# Patient Record
Sex: Male | Born: 1960
Health system: Southern US, Community
[De-identification: ages and names within clinical notes are randomized; demographics above are authoritative.]

## PROBLEM LIST (undated history)

## (undated) DIAGNOSIS — I10 Essential (primary) hypertension: Secondary | ICD-10-CM

## (undated) HISTORY — PX: CARDIAC CATHETERIZATION: SHX172

## (undated) HISTORY — PX: CARDIAC SURGERY: SHX584

---

## 2004-10-02 ENCOUNTER — Ambulatory Visit: Payer: Self-pay

## 2006-06-09 ENCOUNTER — Encounter: Admission: RE | Admit: 2006-06-09 | Discharge: 2006-06-09 | Payer: Self-pay | Admitting: Cardiovascular Disease

## 2006-06-10 ENCOUNTER — Ambulatory Visit (HOSPITAL_COMMUNITY): Admission: RE | Admit: 2006-06-10 | Discharge: 2006-06-10 | Payer: Self-pay | Admitting: Cardiovascular Disease

## 2010-02-01 ENCOUNTER — Encounter: Admission: RE | Admit: 2010-02-01 | Discharge: 2010-02-01 | Payer: Self-pay | Admitting: Cardiovascular Disease

## 2010-02-02 ENCOUNTER — Ambulatory Visit: Payer: Self-pay | Admitting: Cardiology

## 2010-02-02 ENCOUNTER — Ambulatory Visit (HOSPITAL_COMMUNITY): Admission: RE | Admit: 2010-02-02 | Discharge: 2010-02-03 | Payer: Self-pay | Admitting: Cardiovascular Disease

## 2010-02-08 ENCOUNTER — Encounter (HOSPITAL_COMMUNITY): Admission: RE | Admit: 2010-02-08 | Discharge: 2010-03-15 | Payer: Self-pay | Admitting: Cardiovascular Disease

## 2010-06-01 ENCOUNTER — Encounter: Admission: RE | Admit: 2010-06-01 | Discharge: 2010-06-01 | Payer: Self-pay | Admitting: Cardiovascular Disease

## 2010-09-29 LAB — URINALYSIS, ROUTINE W REFLEX MICROSCOPIC
Ketones, ur: NEGATIVE mg/dL
Leukocytes, UA: NEGATIVE
Nitrite: NEGATIVE
Protein, ur: NEGATIVE mg/dL
Urobilinogen, UA: 1 mg/dL (ref 0.0–1.0)

## 2010-09-29 LAB — CBC
MCH: 31.1 pg (ref 26.0–34.0)
MCV: 90 fL (ref 78.0–100.0)
Platelets: 194 10*3/uL (ref 150–400)
RBC: 4.52 MIL/uL (ref 4.22–5.81)

## 2010-09-29 LAB — BASIC METABOLIC PANEL
BUN: 8 mg/dL (ref 6–23)
CO2: 29 mEq/L (ref 19–32)
Chloride: 107 mEq/L (ref 96–112)
Creatinine, Ser: 1.03 mg/dL (ref 0.4–1.5)
GFR calc Af Amer: >60 mL/min (ref >60)
Glucose, Bld: 96 mg/dL (ref 70–99)

## 2010-11-03 ENCOUNTER — Emergency Department (HOSPITAL_COMMUNITY): Payer: BC Managed Care – PPO

## 2010-11-03 ENCOUNTER — Ambulatory Visit (HOSPITAL_COMMUNITY)
Admission: EM | Admit: 2010-11-03 | Discharge: 2010-11-05 | DRG: 125 | Disposition: A | Discharge status: Home / Self Care | Payer: BC Managed Care – PPO | Attending: Cardiovascular Disease | Admitting: Cardiovascular Disease

## 2010-11-03 DIAGNOSIS — I2510 Atherosclerotic heart disease of native coronary artery without angina pectoris: Secondary | ICD-10-CM | POA: Insufficient documentation

## 2010-11-03 DIAGNOSIS — R0789 Other chest pain: Secondary | ICD-10-CM | POA: Insufficient documentation

## 2010-11-03 DIAGNOSIS — Z01810 Encounter for preprocedural cardiovascular examination: Secondary | ICD-10-CM | POA: Insufficient documentation

## 2010-11-03 DIAGNOSIS — R7309 Other abnormal glucose: Secondary | ICD-10-CM | POA: Insufficient documentation

## 2010-11-03 DIAGNOSIS — Z01818 Encounter for other preprocedural examination: Secondary | ICD-10-CM | POA: Insufficient documentation

## 2010-11-03 DIAGNOSIS — E669 Obesity, unspecified: Secondary | ICD-10-CM | POA: Insufficient documentation

## 2010-11-03 DIAGNOSIS — Z9861 Coronary angioplasty status: Secondary | ICD-10-CM | POA: Insufficient documentation

## 2010-11-03 DIAGNOSIS — Z8249 Family history of ischemic heart disease and other diseases of the circulatory system: Secondary | ICD-10-CM | POA: Insufficient documentation

## 2010-11-03 DIAGNOSIS — Z01812 Encounter for preprocedural laboratory examination: Secondary | ICD-10-CM | POA: Insufficient documentation

## 2010-11-03 DIAGNOSIS — E7800 Pure hypercholesterolemia, unspecified: Secondary | ICD-10-CM | POA: Insufficient documentation

## 2010-11-03 DIAGNOSIS — I10 Essential (primary) hypertension: Secondary | ICD-10-CM | POA: Insufficient documentation

## 2010-11-03 LAB — HEMOGLOBIN A1C
Hgb A1c MFr Bld: 5.7 % — ABNORMAL HIGH (ref <5.7)
Mean Plasma Glucose: 117 mg/dL — ABNORMAL HIGH (ref <117)

## 2010-11-03 LAB — CK TOTAL AND CKMB (NOT AT ARMC)
CK, MB: 0.9 ng/mL (ref 0.3–4.0)
Relative Index: 0.7 (ref 0.0–2.5)
Total CK: 128 U/L (ref 7–232)

## 2010-11-03 LAB — BASIC METABOLIC PANEL
CO2: 26 mEq/L (ref 19–32)
Chloride: 112 mEq/L (ref 96–112)
Creatinine, Ser: 0.92 mg/dL (ref 0.4–1.5)
GFR calc Af Amer: >60 mL/min (ref >60)
Potassium: 3.6 mEq/L (ref 3.5–5.1)

## 2010-11-03 LAB — PROTIME-INR
INR: 1.1 (ref 0.00–1.49)
Prothrombin Time: 14.4 seconds (ref 11.6–15.2)

## 2010-11-03 LAB — COMPREHENSIVE METABOLIC PANEL
ALT: 35 U/L (ref 0–53)
BUN: 8 mg/dL (ref 6–23)
CO2: 27 mEq/L (ref 19–32)
Calcium: 8.9 mg/dL (ref 8.4–10.5)
Creatinine, Ser: 0.98 mg/dL (ref 0.4–1.5)
GFR calc non Af Amer: >60 mL/min (ref >60)
Glucose, Bld: 89 mg/dL (ref 70–99)
Total Protein: 7 g/dL (ref 6.0–8.3)

## 2010-11-03 LAB — CBC
HCT: 40.2 % (ref 39.0–52.0)
MCHC: 35.3 g/dL (ref 30.0–36.0)
MCV: 84.3 fL (ref 78.0–100.0)
Platelets: 247 10*3/uL (ref 150–400)
RDW: 13.4 % (ref 11.5–15.5)

## 2010-11-03 LAB — TROPONIN I: Troponin I: 0.01 ng/mL (ref 0.00–0.06)

## 2010-11-03 LAB — CARDIAC PANEL(CRET KIN+CKTOT+MB+TROPI)
Total CK: 124 U/L (ref 7–232)
Troponin I: 0.01 ng/mL (ref 0.00–0.06)

## 2010-11-03 LAB — HEPARIN LEVEL (UNFRACTIONATED): Heparin Unfractionated: 0.43 IU/mL (ref 0.30–0.70)

## 2010-11-04 LAB — GLUCOSE, CAPILLARY
Glucose-Capillary: 125 mg/dL — ABNORMAL HIGH (ref 70–99)
Glucose-Capillary: 126 mg/dL — ABNORMAL HIGH (ref 70–99)

## 2010-11-04 LAB — CBC
HCT: 39.8 % (ref 39.0–52.0)
MCH: 29.4 pg (ref 26.0–34.0)
MCV: 85.4 fL (ref 78.0–100.0)
RDW: 13.5 % (ref 11.5–15.5)
WBC: 8.4 10*3/uL (ref 4.0–10.5)

## 2010-11-04 LAB — BASIC METABOLIC PANEL
BUN: 11 mg/dL (ref 6–23)
CO2: 26 mEq/L (ref 19–32)
Calcium: 9 mg/dL (ref 8.4–10.5)
Glucose, Bld: 88 mg/dL (ref 70–99)
Sodium: 142 mEq/L (ref 135–145)

## 2010-11-04 LAB — LIPID PANEL
LDL Cholesterol: 75 mg/dL (ref 0–99)
Total CHOL/HDL Ratio: 4.4 RATIO
VLDL: 19 mg/dL (ref 0–40)

## 2010-11-04 LAB — CARDIAC PANEL(CRET KIN+CKTOT+MB+TROPI): Total CK: 91 U/L (ref 7–232)

## 2010-11-05 LAB — PLATELET INHIBITION P2Y12
P2Y12 % Inhibition: 34 %
Platelet Function  P2Y12: 194 [PRU] (ref 194–418)
Platelet Function Baseline: 292 [PRU] (ref 194–418)

## 2010-11-05 LAB — CBC
HCT: 40.1 % (ref 39.0–52.0)
Platelets: 211 10*3/uL (ref 150–400)
RDW: 13.5 % (ref 11.5–15.5)
WBC: 6 10*3/uL (ref 4.0–10.5)

## 2010-11-05 LAB — GLUCOSE, CAPILLARY: Glucose-Capillary: 95 mg/dL (ref 70–99)

## 2010-11-06 NOTE — Discharge Summary (Signed)
NAMEJENSON, BEEDLE                 ACCOUNT NO.:  [id number]  MEDICAL RECORD NO.:  [id number]           PATIENT TYPE:  I  LOCATION:  3703                         FACILITY:  [facility]  PHYSICIAN:  Ricki Rodriguez, M.D.  DATE OF BIRTH:  [birth year]  DATE OF ADMISSION:  11/03/2010 DATE OF DISCHARGE:  11/05/2010                              DISCHARGE SUMMARY   ADMITTED BY:  Eduardo Osier. Sharyn Lull, MD  FINAL DIAGNOSES: 1. Chest pain. 2. Hypertension. 3. Obesity. 4. Coronary artery disease. 5. Status post stent placement in right coronary artery.  DISCHARGE MEDICATIONS: 1. Nitroglycerin sublingual 0.4 mg one under the tongue every 5     minutes x3 as needed and directed for chest pain. 2. Crestor 5 mg once daily. 3. Norvasc 5 mg half a tablet daily. 4. Aspirin 81 mg daily. 5. CoQ10 over-the-counter 50-100 mg one daily. 6. Metoprolol succinate XL one daily. 7. Plavix 75 mg one daily.  DISCHARGE DIET:  Low-sodium, heart-healthy diet.  DISCHARGE ACTIVITY:  The patient to increase activity gradually as tolerated.  No driving or sexual activity for 2 days. Followup by Dr. Orpah Cobb in 1 week.  WOUND CARE INSTRUCTIONS:  The patient to notify right groin pain swelling or discharge.  HISTORY:  This is a 50 year old Asian American male with known history of coronary artery disease, had exertional chest pain off and on for last few days.  PHYSICAL EXAMINATION:  GENERAL:  The patient is a well-built, well- nourished male in no acute distress. VITAL SIGNS:  Temperature 97.8, pulse 62, respirations 18, blood pressure 112/65, oxygen saturation 96% on room air. HEENT:  The patient is normocephalic, atraumatic with brown eyes. Conjunctivae pink.  Sclerae nonicteric. NECK:  No carotid bruit. LUNGS:  Clear bilaterally. HEART:  Normal S1 and S2 without S3 gallop. ABDOMEN:  Soft, distended but nontender. EXTREMITIES:  No edema, cyanosis, clubbing.  LABORATORY DATA:  Normal hemoglobin,  hematocrit, WBC count, platelet count.  Normal electrolytes, BUN, creatinine.  Glucose borderline at 113.  CK-MB and troponin-I negative x3.  Hemoglobin A1c 5.7.  Platelet inhibition 34%.  EKG, sinus rhythm with nonspecific ST-T changes.  Chest x-ray, stable examination with mild central airway thickening, no acute cardiopulmonary process.  Cardiac catheterization done today showed mild-to-moderate multivessel native vessel coronary artery disease, patent stent in right coronary artery, and severe native vessel ramus branch disease; however, the ramus branch was less than 1.5 mm in size, left ventricular systolic function was normal.  HOSPITAL COURSE:  The patient was admitted to telemetry unit and myocardial infarction was ruled out.  Because of his atypical chest pain, he underwent cardiac catheterization that showed mild-to-moderate multivessel coronary artery disease and a severe left circumflex coronary artery ramus branch disease; however, the ramus branch was less than 1.5 mm in diameter.  Hence, it was decided to treat the patient medically.  His Crestor dose was increased.  He was advised to take Norvasc regularly and nitroglycerin sublingual was also prescribed along with continuing metoprolol, aspirin, and Plavix. He declined changing  Plavix to Effient or Brilinta. He will be followed by me in 1-2 weeks, at  which time he will be offered Effient or Brilinta again.     Ricki Rodriguez, M.D.     ASK/MEDQ  D:  11/05/2010  T:  11/06/2010  Job:  [id number]  Electronically Signed by Orpah Cobb M.D. on 11/06/2010 02:27:38 PM

## 2011-06-19 IMAGING — US US RENAL
1 series · 14 of 25 positions shown · non-contrast
Comparison: None.

CLINICAL DATA: Hematuria.

RENAL/URINARY TRACT ULTRASOUND COMPLETE

[Series 1: us renal · 0.31mm/px · 14 of 47 slices shown]
[im 1/47]
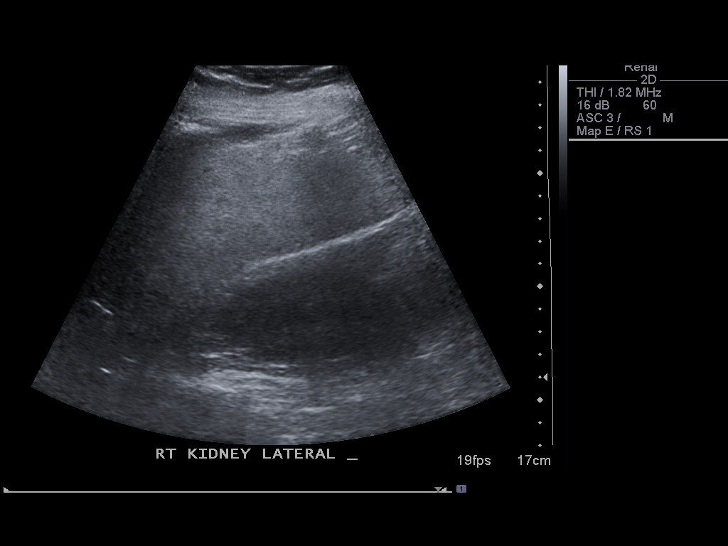
[im 4/47]
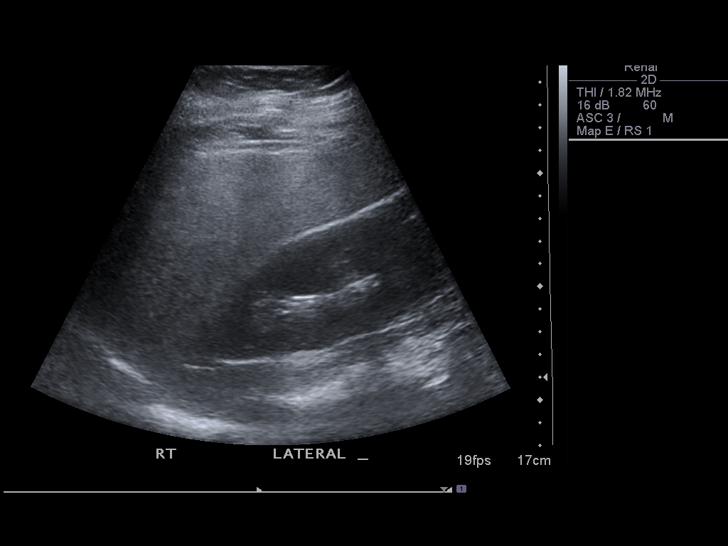
[im 8/47]
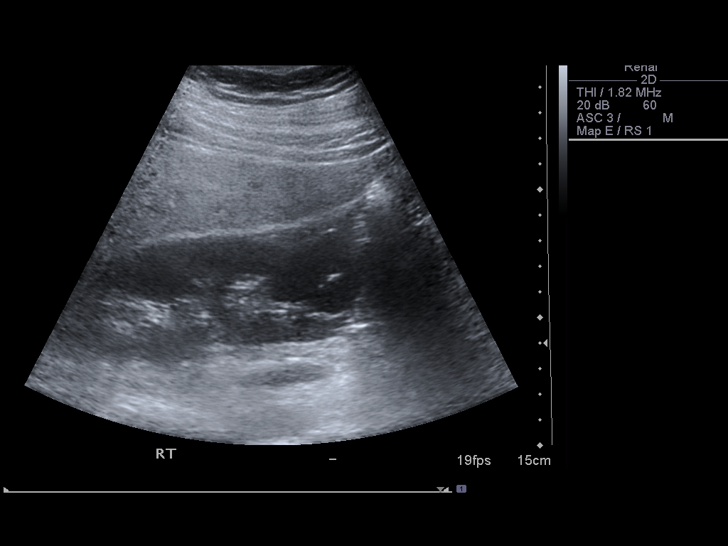
[im 12/47]
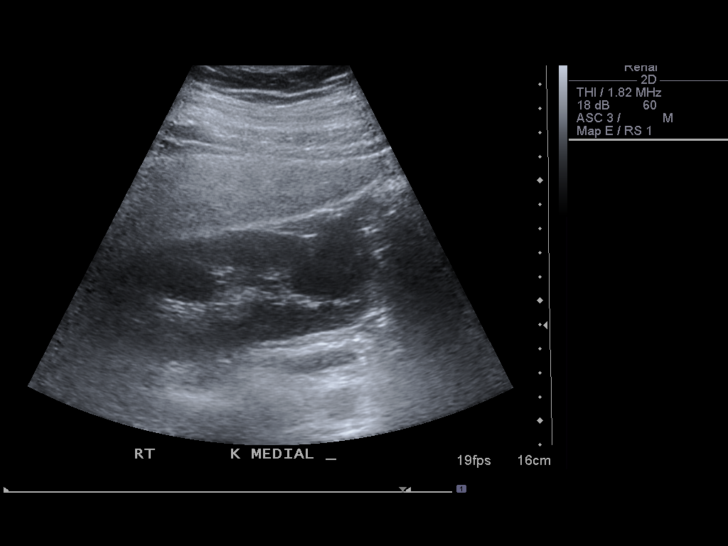
[im 16/47]
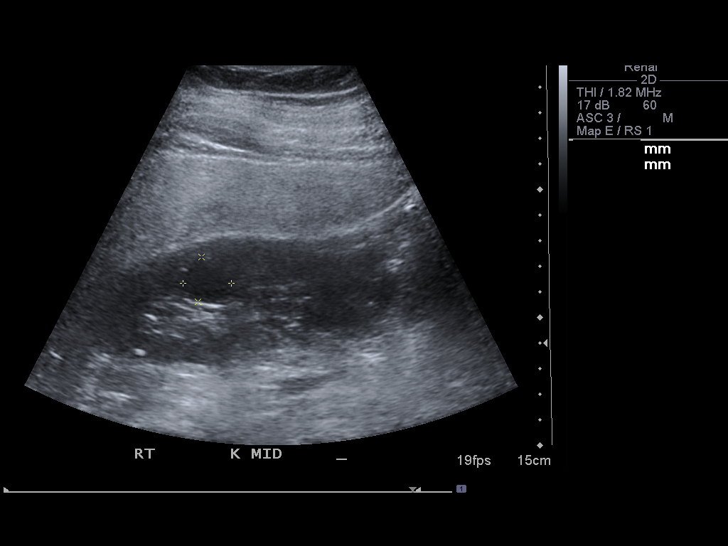
[im 18/47]
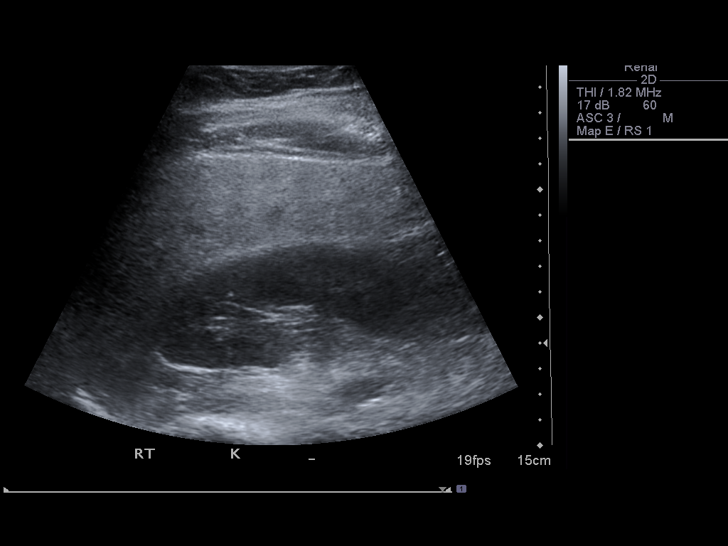
[im 22/47]
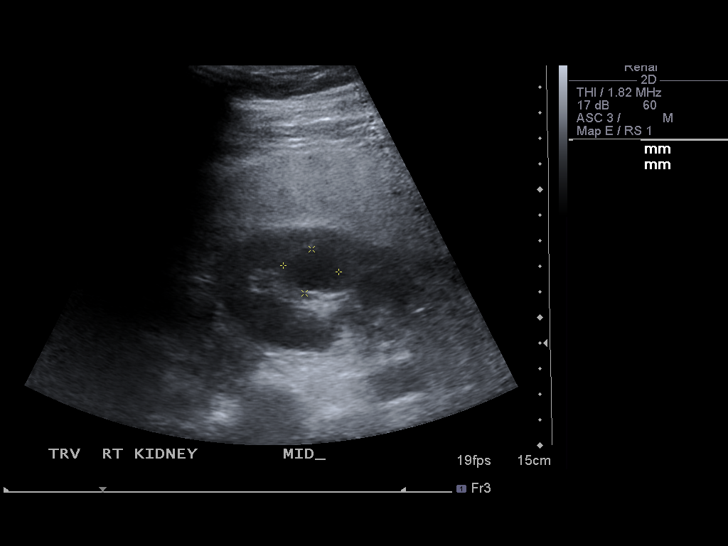
[im 25/47]
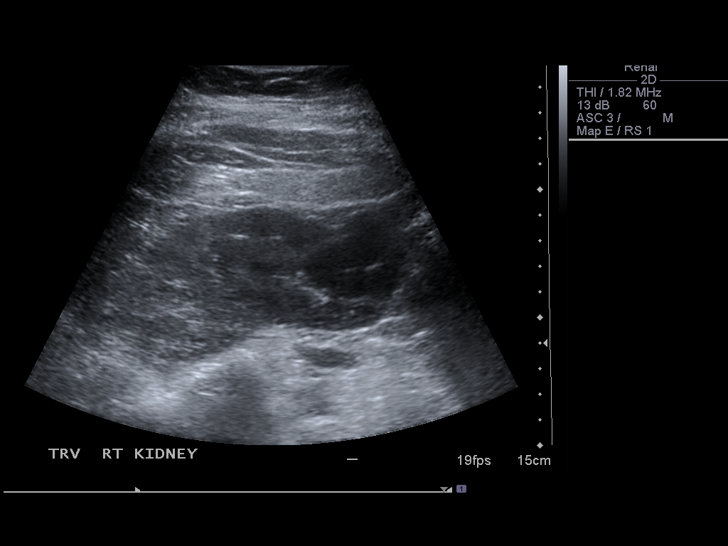
[im 29/47]
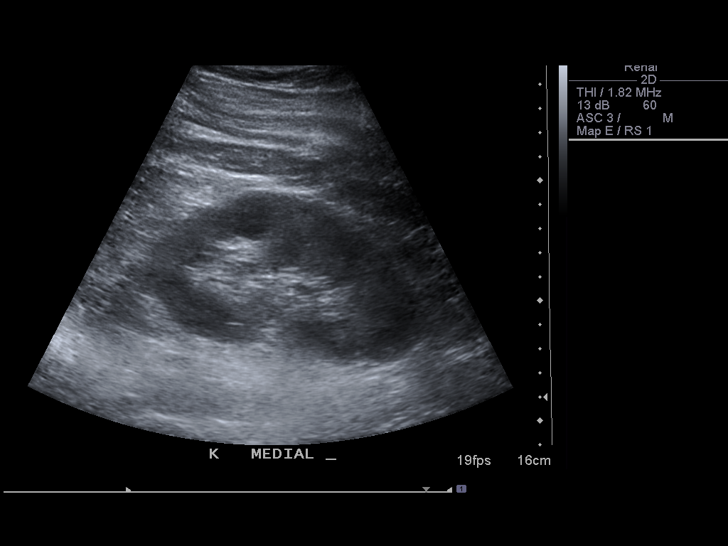
[im 31/47]
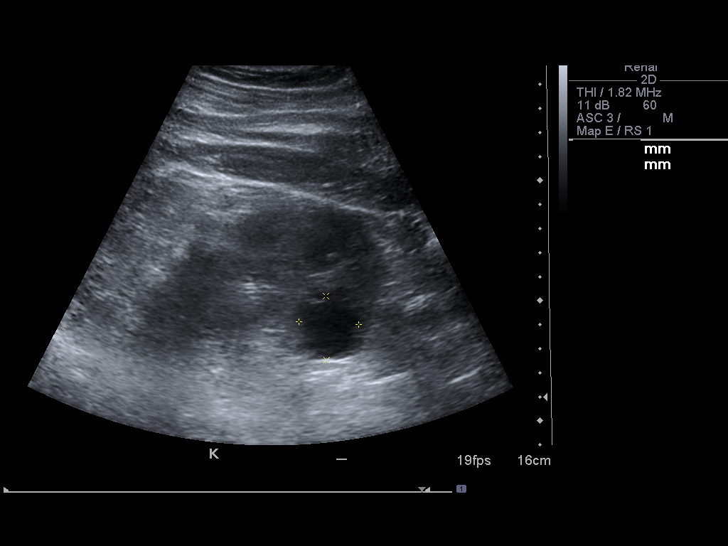
[im 35/47]
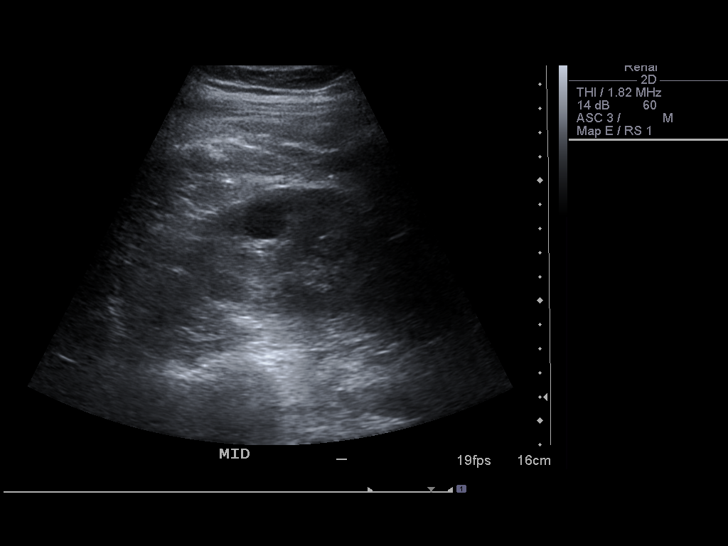
[im 39/47]
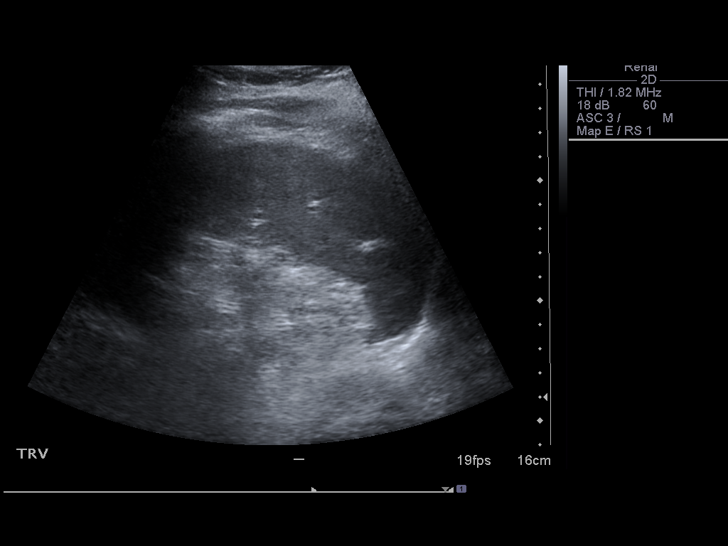
[im 43/47]
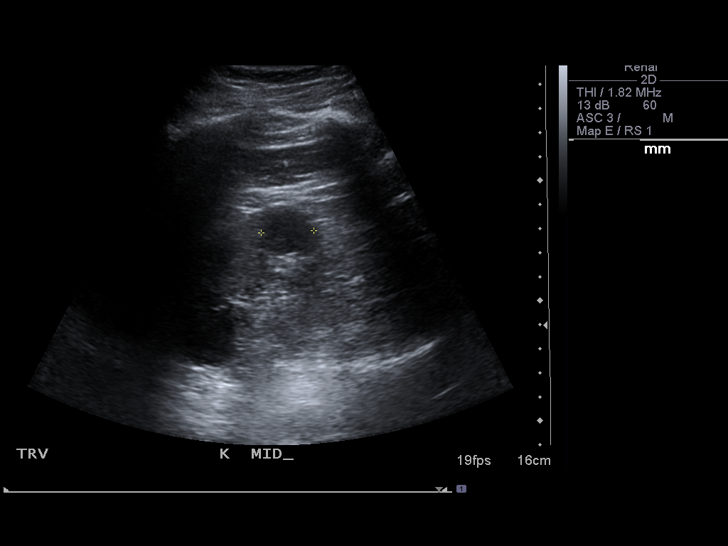
[im 47/47]
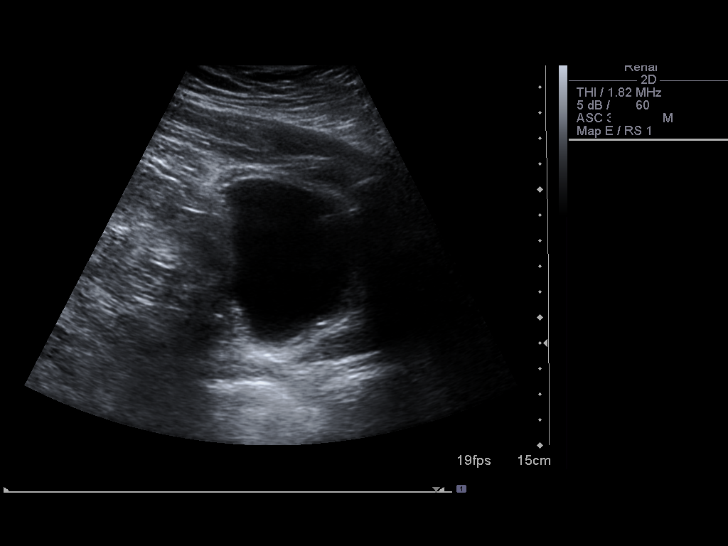

[14 of 25 positions shown; findings below may reference images not displayed]

FINDINGS: Right Kidney:  12.3 cm. No hydronephrosis.  A lower pole right
renal lesion measures 3.2 x 3.1 x 3.1 cm.  This is primarily
hypoechoic to anechoic.  Has at least one echogenic focus within.
Example image nine.

More simple appearing cystic lesions are identified within the
remainder of the right kidney.  These measure up to 2.7 cm.

Left Kidney:  11.6 cm. No hydronephrosis.  Anechoic lesions are
likely cysts.  These measure up to 2.7 cm in the lower pole.

Bladder:  Within normal limits. Incidental note is made of
increased echogenicity of the liver, likely related to fatty
infiltration.
IMPRESSION: 1.  Bilateral renal lesions.  The majority of these are consistent
with cysts.  A complex lesion in the lower pole right kidney could
represent a complex cyst or a renal cell carcinoma.  Further
evaluation with pre and postcontrast CT or MRI is recommended.  MRI
is likely of slightly increased accuracy.  If this is performed,
this should be done as an outpatient.
2.  Fatty infiltration of the liver.

## 2016-04-08 DIAGNOSIS — E782 Mixed hyperlipidemia: Secondary | ICD-10-CM | POA: Diagnosis not present

## 2016-04-08 DIAGNOSIS — I209 Angina pectoris, unspecified: Secondary | ICD-10-CM | POA: Diagnosis not present

## 2016-04-08 DIAGNOSIS — E7800 Pure hypercholesterolemia, unspecified: Secondary | ICD-10-CM | POA: Diagnosis not present

## 2016-04-08 DIAGNOSIS — E559 Vitamin D deficiency, unspecified: Secondary | ICD-10-CM | POA: Diagnosis not present

## 2016-04-30 DIAGNOSIS — E559 Vitamin D deficiency, unspecified: Secondary | ICD-10-CM | POA: Diagnosis not present

## 2016-04-30 DIAGNOSIS — I2510 Atherosclerotic heart disease of native coronary artery without angina pectoris: Secondary | ICD-10-CM | POA: Diagnosis not present

## 2016-04-30 DIAGNOSIS — Z9861 Coronary angioplasty status: Secondary | ICD-10-CM | POA: Diagnosis not present

## 2016-04-30 DIAGNOSIS — I10 Essential (primary) hypertension: Secondary | ICD-10-CM | POA: Diagnosis not present

## 2016-05-20 DIAGNOSIS — I2510 Atherosclerotic heart disease of native coronary artery without angina pectoris: Secondary | ICD-10-CM | POA: Diagnosis not present

## 2016-05-20 DIAGNOSIS — I10 Essential (primary) hypertension: Secondary | ICD-10-CM | POA: Diagnosis not present

## 2016-05-20 DIAGNOSIS — E7800 Pure hypercholesterolemia, unspecified: Secondary | ICD-10-CM | POA: Diagnosis not present

## 2016-08-09 DIAGNOSIS — I10 Essential (primary) hypertension: Secondary | ICD-10-CM | POA: Diagnosis not present

## 2016-08-09 DIAGNOSIS — E7800 Pure hypercholesterolemia, unspecified: Secondary | ICD-10-CM | POA: Diagnosis not present

## 2016-08-09 DIAGNOSIS — I2510 Atherosclerotic heart disease of native coronary artery without angina pectoris: Secondary | ICD-10-CM | POA: Diagnosis not present

## 2016-08-09 DIAGNOSIS — Z9861 Coronary angioplasty status: Secondary | ICD-10-CM | POA: Diagnosis not present

## 2016-09-23 ENCOUNTER — Emergency Department (HOSPITAL_COMMUNITY)
Admission: EM | Admit: 2016-09-23 | Discharge: 2016-09-23 | Disposition: A | Discharge status: Home / Self Care | Payer: BLUE CROSS/BLUE SHIELD | Attending: Emergency Medicine | Admitting: Emergency Medicine

## 2016-09-23 ENCOUNTER — Emergency Department (HOSPITAL_COMMUNITY): Payer: BLUE CROSS/BLUE SHIELD

## 2016-09-23 ENCOUNTER — Encounter (HOSPITAL_COMMUNITY): Payer: Self-pay | Admitting: *Deleted

## 2016-09-23 DIAGNOSIS — Z79899 Other long term (current) drug therapy: Secondary | ICD-10-CM | POA: Insufficient documentation

## 2016-09-23 DIAGNOSIS — J111 Influenza due to unidentified influenza virus with other respiratory manifestations: Secondary | ICD-10-CM | POA: Diagnosis not present

## 2016-09-23 DIAGNOSIS — J189 Pneumonia, unspecified organism: Secondary | ICD-10-CM | POA: Diagnosis not present

## 2016-09-23 DIAGNOSIS — I10 Essential (primary) hypertension: Secondary | ICD-10-CM | POA: Insufficient documentation

## 2016-09-23 DIAGNOSIS — R509 Fever, unspecified: Secondary | ICD-10-CM | POA: Diagnosis not present

## 2016-09-23 DIAGNOSIS — R69 Illness, unspecified: Secondary | ICD-10-CM

## 2016-09-23 DIAGNOSIS — R05 Cough: Secondary | ICD-10-CM | POA: Diagnosis not present

## 2016-09-23 HISTORY — DX: Essential (primary) hypertension: I10

## 2016-09-23 LAB — CBC WITH DIFFERENTIAL/PLATELET
BASOS PCT: 0 %
Basophils Absolute: 0 10*3/uL (ref 0.0–0.1)
EOS PCT: 0 %
Eosinophils Absolute: 0 10*3/uL (ref 0.0–0.7)
HCT: 41.8 % (ref 39.0–52.0)
HEMOGLOBIN: 14.6 g/dL (ref 13.0–17.0)
Lymphocytes Relative: 32 %
Lymphs Abs: 1 10*3/uL (ref 0.7–4.0)
MCH: 30.2 pg (ref 26.0–34.0)
MCHC: 34.9 g/dL (ref 30.0–36.0)
MCV: 86.4 fL (ref 78.0–100.0)
MONO ABS: 0.1 10*3/uL (ref 0.1–1.0)
Monocytes Relative: 3 %
NEUTROS ABS: 2.1 10*3/uL (ref 1.7–7.7)
Neutrophils Relative %: 65 %
Platelets: 132 10*3/uL — ABNORMAL LOW (ref 150–400)
RBC: 4.84 MIL/uL (ref 4.22–5.81)
RDW: 14.1 % (ref 11.5–15.5)
WBC MORPHOLOGY: INCREASED
WBC: 3.2 10*3/uL — ABNORMAL LOW (ref 4.0–10.5)

## 2016-09-23 LAB — I-STAT CG4 LACTIC ACID, ED
LACTIC ACID, VENOUS: 1.23 mmol/L (ref 0.5–1.9)
Lactic Acid, Venous: 1.01 mmol/L (ref 0.5–1.9)

## 2016-09-23 LAB — URINALYSIS, ROUTINE W REFLEX MICROSCOPIC
BILIRUBIN URINE: NEGATIVE
Bacteria, UA: NONE SEEN
Glucose, UA: NEGATIVE mg/dL
Hgb urine dipstick: NEGATIVE
KETONES UR: 20 mg/dL — AB
Leukocytes, UA: NEGATIVE
Nitrite: NEGATIVE
PH: 5 (ref 5.0–8.0)
PROTEIN: 30 mg/dL — AB
Specific Gravity, Urine: 1.024 (ref 1.005–1.030)

## 2016-09-23 LAB — INFLUENZA PANEL BY PCR (TYPE A & B)
INFLBPCR: NEGATIVE
Influenza A By PCR: NEGATIVE

## 2016-09-23 LAB — COMPREHENSIVE METABOLIC PANEL
ALBUMIN: 3.7 g/dL (ref 3.5–5.0)
ALK PHOS: 49 U/L (ref 38–126)
ALT: 68 U/L — AB (ref 17–63)
AST: 99 U/L — AB (ref 15–41)
Anion gap: 9 (ref 5–15)
BUN: 12 mg/dL (ref 6–20)
CALCIUM: 8.3 mg/dL — AB (ref 8.9–10.3)
CHLORIDE: 102 mmol/L (ref 101–111)
CO2: 22 mmol/L (ref 22–32)
CREATININE: 1.21 mg/dL (ref 0.61–1.24)
GFR calc Af Amer: >60 mL/min (ref >60)
GFR calc non Af Amer: >60 mL/min (ref >60)
GLUCOSE: 140 mg/dL — AB (ref 65–99)
Potassium: 3.5 mmol/L (ref 3.5–5.1)
SODIUM: 133 mmol/L — AB (ref 135–145)
Total Bilirubin: 0.7 mg/dL (ref 0.3–1.2)
Total Protein: 6.7 g/dL (ref 6.5–8.1)

## 2016-09-23 MED ORDER — ACETAMINOPHEN 500 MG PO TABS
500.0000 mg | ORAL_TABLET | Freq: Once | ORAL | Status: AC
  Administered 2016-09-23: 500 mg via ORAL
  Filled 2016-09-23: qty 1

## 2016-09-23 MED ORDER — SODIUM CHLORIDE 0.9 % IV BOLUS (SEPSIS)
1000.0000 mL | Freq: Once | INTRAVENOUS | Status: AC
  Administered 2016-09-23: 1000 mL via INTRAVENOUS

## 2016-09-23 MED ORDER — DEXTROSE 5 % IV SOLN
500.0000 mg | Freq: Once | INTRAVENOUS | Status: AC
  Administered 2016-09-23: 500 mg via INTRAVENOUS
  Filled 2016-09-23: qty 500

## 2016-09-23 MED ORDER — SODIUM CHLORIDE 0.9 % IV BOLUS (SEPSIS)
500.0000 mL | Freq: Once | INTRAVENOUS | Status: AC
  Administered 2016-09-23: 500 mL via INTRAVENOUS

## 2016-09-23 MED ORDER — DEXTROSE 5 % IV SOLN
1.0000 g | Freq: Once | INTRAVENOUS | Status: AC
  Administered 2016-09-23: 1 g via INTRAVENOUS
  Filled 2016-09-23: qty 10

## 2016-09-23 MED ORDER — HYDROCODONE-HOMATROPINE 5-1.5 MG/5ML PO SYRP: 5.0000 mL | ORAL_SOLUTION | Freq: Four times a day (QID) | ORAL | 0 refills | Status: DC | PRN

## 2016-09-23 MED ORDER — HYDROCODONE-HOMATROPINE 5-1.5 MG/5ML PO SYRP
5.0000 mL | ORAL_SOLUTION | Freq: Once | ORAL | Status: AC
  Administered 2016-09-23: 5 mL via ORAL
  Filled 2016-09-23: qty 5

## 2016-09-23 MED ORDER — IBUPROFEN 400 MG PO TABS
600.0000 mg | ORAL_TABLET | Freq: Once | ORAL | Status: AC
  Administered 2016-09-23: 600 mg via ORAL
  Filled 2016-09-23: qty 1

## 2016-09-23 MED ORDER — LEVOFLOXACIN 750 MG PO TABS: 750.0000 mg | ORAL_TABLET | Freq: Every day | ORAL | 0 refills | Status: DC

## 2016-09-23 NOTE — ED Notes (Signed)
EDP made aware that antibiotics started prior to order for blood cultures. EDP verbally canceled order.

## 2016-09-23 NOTE — Discharge Instructions (Signed)
Rest and drink plenty of fluids Take Tylenol or Ibuprofen for fever or pain Wash your hands and cover your mouth when coughing/sneezing Take antibiotic for 5 days - take with food Follow up with Dr. Jacinto HalimGanji this week Return if symptoms are worsening

## 2016-09-23 NOTE — ED Triage Notes (Signed)
Pt states arrived from trip to [place] yesterday.  States fevers, chills, body aches, headache, sore throat and cough x 6 days.  Symptoms are not relieved with ibuprofen (last took 10 am this morning).

## 2016-09-23 NOTE — ED Provider Notes (Signed)
Patient signed out to me by Azucena Kubayler Leaphart PA-C. He is a 56 year old male with flu like symptoms for 6 days. CXR remarkable for bibasilar CAP.  He is being treated with fluids, antipyretics, and antibiotics. After treatment and checking ambulatory sats he will be reassessed to determine dispo.  On recheck, pt feels somewhat better. Dr. Penne LashIssacs spoke with ID who agrees this is likely a viral illness. Flu is negative. He is tolerating PO and ambulatory O2 sats are >90%. Will d/c with Levaquin and cough medicine. Advised to follow up with his PCP this week. He verbalized understanding. Return precautions given.   Bethel BornKelly Marie Daquisha Clermont, PA-C 09/23/16 1959    Shaune Pollackameron Isaacs, MD 09/25/16 [phone]

## 2016-09-23 NOTE — ED Notes (Signed)
Pt. Ambulated to restroom with steady gait. Pulse oximetry 93% during ambulation.

## 2016-09-23 NOTE — ED Provider Notes (Signed)
[facility]PT Provider Note   CSN: [id number] Arrival date & time: 09/23/16  1123     History   Chief Complaint Chief Complaint  Patient presents with  . Fever    HPI Joshua Phillips is a 56 y.o. male.  HPI 56 year old male with a past medical history significant for hypertension presents to the ED today with complaints of fevers, chills, body aches, headache, sore throat and cough. Patient states that his symptoms started approximately 6 days ago. Patient just arrived back from [place] after a 5 day trip. States that on his first day in [place] he starting developing a sore throat and body aches. The cough has progressively worsened. He has been taken Advil around the clock for his fevers. Denies any mucus production. He does endorse rhinorrhea, sore throat. States that his wife is sick as well. Denies getting a flu shot this year. Denies any lower extremity edema or calf tenderness. Denies any chest pain or shortness of breath. Past Medical History:  Diagnosis Date  . Hypertension     There are no active problems to display for this patient.   Past Surgical History:  Procedure Laterality Date  . CARDIAC SURGERY     stent placement       Home Medications    Prior to Admission medications   Not on File    Family History No family history on file.  Social History Social History  Substance Use Topics  . Smoking status: Never Smoker  . Smokeless tobacco: Never Used  . Alcohol use Yes     Comment: occ     Allergies   Patient has no known allergies.   Review of Systems Review of Systems  Constitutional: Positive for chills, fatigue and fever.  HENT: Positive for congestion, postnasal drip, rhinorrhea and sore throat.   Eyes: Negative for visual disturbance.  Respiratory: Positive for cough. Negative for shortness of breath and wheezing.   Cardiovascular: Negative for chest pain, palpitations and leg swelling.  Gastrointestinal: Positive for nausea.  Negative for diarrhea and vomiting.  Genitourinary: Negative for dysuria, frequency and urgency.  Musculoskeletal: Positive for myalgias.  Skin: Negative.   Neurological: Positive for headaches. Negative for dizziness, syncope, weakness and light-headedness.  All other systems reviewed and are negative.    Physical Exam Updated Vital Signs BP 113/69 (BP Location: Left Arm)   Pulse 95   Temp 98.7 F (37.1 C) (Oral)   Resp 18   Ht 5\' 8"  (1.727 m)   Wt 86.2 kg   SpO2 93%   BMI 28.89 kg/m   Physical Exam  Constitutional: He is oriented to person, place, and time. He appears well-developed and well-nourished. No distress.  Patient is nontoxic appearing. However he is laying on the bed wrapped in a blanket shivering.  HENT:  Head: Normocephalic and atraumatic.  Right Ear: Tympanic membrane, external ear and ear canal normal.  Left Ear: Tympanic membrane, external ear and ear canal normal.  Nose: Mucosal edema and rhinorrhea present.  Mouth/Throat: Uvula is midline and mucous membranes are normal. No trismus in the jaw. Posterior oropharyngeal erythema present. No oropharyngeal exudate, posterior oropharyngeal edema or tonsillar abscesses.  Eyes: Conjunctivae are normal. Right eye exhibits no discharge. Left eye exhibits no discharge. No scleral icterus.  Neck: Normal range of motion. Neck supple. No thyromegaly present.  Cardiovascular: Normal rate, regular rhythm, normal heart sounds and intact distal pulses.   Pulmonary/Chest: Effort normal. No accessory muscle usage. No tachypnea. No respiratory distress. He  has no decreased breath sounds. He has no wheezes. He has rhonchi in the right middle field, the right lower field and the left lower field. He has no rales. He exhibits tenderness.  Patient is not hypoxic. No tachypnea.  Abdominal: Soft. Bowel sounds are normal. He exhibits no distension. There is no tenderness. There is no rebound and no guarding.  Musculoskeletal: Normal  range of motion.  No lower extremity edema or calf tenderness. DP pulses are 2+ bilaterally. Full range of motion.  Lymphadenopathy:    He has no cervical adenopathy.  Neurological: He is alert and oriented to person, place, and time.  Skin: Skin is warm and dry. Capillary refill takes less than 2 seconds.  Nursing note and vitals reviewed.    ED Treatments / Results  Labs (all labs ordered are listed, but only abnormal results are displayed) Labs Reviewed  COMPREHENSIVE METABOLIC PANEL - Abnormal; Notable for the following:       Result Value   Sodium 133 (*)    Glucose, Bld 140 (*)    Calcium 8.3 (*)    AST 99 (*)    ALT 68 (*)    All other components within normal limits  CBC WITH DIFFERENTIAL/PLATELET - Abnormal; Notable for the following:    WBC 3.2 (*)    Platelets 132 (*)    All other components within normal limits  URINALYSIS, ROUTINE W REFLEX MICROSCOPIC - Abnormal; Notable for the following:    Color, Urine AMBER (*)    APPearance HAZY (*)    Ketones, ur 20 (*)    Protein, ur 30 (*)    Squamous Epithelial / LPF 0-5 (*)    All other components within normal limits  INFLUENZA PANEL BY PCR (TYPE A & B)  I-STAT CG4 LACTIC ACID, ED  I-STAT CG4 LACTIC ACID, ED    EKG  EKG Interpretation None       Radiology Dg Chest 2 View  Result Date: 09/23/2016 CLINICAL DATA:  Cough and body aches for the past week EXAM: CHEST  2 VIEW COMPARISON:  Chest x-ray dated November 03, 2010 FINDINGS: The lungs are well-expanded. The interstitial markings are coarse and more conspicuous overall. There is patchy airspace opacity peripherally in the left lower lung and in the right infrahilar region. There is no pleural effusion or pneumothorax. The heart and pulmonary vascularity are normal. There is calcification in the wall of the aortic arch. The bony thorax exhibits no acute abnormality. IMPRESSION: Patchy airspace opacities bilaterally worrisome for atelectasis or early pneumonia. No  pleural effusion or CHF. Followup PA and lateral chest X-ray is recommended in 3-4 weeks following trial of antibiotic therapy to ensure resolution and exclude underlying malignancy. Thoracic aortic atherosclerosis. Electronically Signed   By: David  [place] M.D.   On: 09/23/2016 12:52    Procedures Procedures (including critical care time)  Medications Ordered in ED Medications  azithromycin (ZITHROMAX) 500 mg in dextrose 5 % 250 mL IVPB (not administered)  ibuprofen (ADVIL,MOTRIN) tablet 600 mg (600 mg Oral Given 09/23/16 1510)  sodium chloride 0.9 % bolus 1,000 mL (1,000 mLs Intravenous New Bag/Given 09/23/16 1538)  cefTRIAXone (ROCEPHIN) 1 g in dextrose 5 % 50 mL IVPB (1 g Intravenous New Bag/Given 09/23/16 1545)  acetaminophen (TYLENOL) tablet 500 mg (500 mg Oral Given 09/23/16 1553)  HYDROcodone-homatropine (HYCODAN) 5-1.5 MG/5ML syrup 5 mL (5 mLs Oral Given 09/23/16 1553)     Initial Impression / Assessment and Plan / ED Course  I have reviewed  the triage vital signs and the nursing notes.  Pertinent labs & imaging results that were available during my care of the patient were reviewed by me and considered in my medical decision making (see chart for details).  Clinical Course as of Sep 24 1610  Mon Sep 23, 2016  1522 WBC Morphology: INCREASED BANDS (>20% BANDS) [CI]    Clinical Course User Index [CI] Shaune Pollack, MD    Patient presents to the ED with complaints of fever, generalized body aches, sore throat, cough. Recent travel to [place]. Symptoms started before arriving to [place]. Lungs with coarse sounds noted bilaterally. Patient is not hypoxic or tachypneic. Chest x-ray shows signs of bilateral focal infiltrate concerning for pneumonia. Patient with mild leukopenia and thrombocytopenia. Could likely be due to a viral infection. Patient is afebrile in the ED. Negative lactic acidosis 2. Urine with signs of dehydration with no signs of infection. Mild elevation of liver enzymes.  Denies any abdominal pain. No focal tenderness. No signs of jaundice. Could likely be due to dehydration or viral infection. All other labs unremarkable. Clinical presentation is not consistent with PE. Patient denies any chest pain or shortness of breath. The patient may have superimposed pneumonia or influenza. Influenza panel pending. Patient is nontoxic appearing. We will give bolus liters of fluid. We'll start IV antibiotics to include azithromycin and Rocephin. We will ambulate patient following IV fluids and antibiotics to make sure that he is able to maintain his saturations. If patient is able to maintain saturations and looks improved after fluids and antibiotics will be discharged with Levaquin. Care hand off to PA [facility]. Pt was seen and evaluated by Dr. Erma Heritage who is agreeable to the above plan. He will follow up on patient as well. Likely d/c home with close follow up in the outpatient setting.   Final Clinical Impressions(s) / ED Diagnoses   Final diagnoses:  None    New Prescriptions New Prescriptions   No medications on file     Rise Mu, PA-C 09/23/16 1652    Rise Mu, PA-C 09/23/16 1653    Shaune Pollack, MD 09/25/16 1154

## 2016-09-23 NOTE — ED Notes (Signed)
EDP at bedside  

## 2016-09-23 NOTE — ED Notes (Signed)
Pt. Tolerating oral fluids.  

## 2016-09-26 ENCOUNTER — Encounter (HOSPITAL_COMMUNITY): Payer: Self-pay | Admitting: Emergency Medicine

## 2016-09-26 ENCOUNTER — Emergency Department (HOSPITAL_COMMUNITY): Payer: BLUE CROSS/BLUE SHIELD

## 2016-09-26 DIAGNOSIS — I2510 Atherosclerotic heart disease of native coronary artery without angina pectoris: Secondary | ICD-10-CM | POA: Diagnosis present

## 2016-09-26 DIAGNOSIS — I10 Essential (primary) hypertension: Secondary | ICD-10-CM | POA: Diagnosis present

## 2016-09-26 DIAGNOSIS — Z7982 Long term (current) use of aspirin: Secondary | ICD-10-CM

## 2016-09-26 DIAGNOSIS — R1013 Epigastric pain: Secondary | ICD-10-CM | POA: Diagnosis not present

## 2016-09-26 DIAGNOSIS — J180 Bronchopneumonia, unspecified organism: Secondary | ICD-10-CM | POA: Diagnosis not present

## 2016-09-26 DIAGNOSIS — I2583 Coronary atherosclerosis due to lipid rich plaque: Secondary | ICD-10-CM | POA: Diagnosis present

## 2016-09-26 DIAGNOSIS — Z9861 Coronary angioplasty status: Secondary | ICD-10-CM

## 2016-09-26 DIAGNOSIS — R112 Nausea with vomiting, unspecified: Secondary | ICD-10-CM | POA: Diagnosis not present

## 2016-09-26 DIAGNOSIS — D72819 Decreased white blood cell count, unspecified: Secondary | ICD-10-CM | POA: Diagnosis present

## 2016-09-26 DIAGNOSIS — Z8249 Family history of ischemic heart disease and other diseases of the circulatory system: Secondary | ICD-10-CM

## 2016-09-26 DIAGNOSIS — R0602 Shortness of breath: Secondary | ICD-10-CM | POA: Diagnosis not present

## 2016-09-26 DIAGNOSIS — R05 Cough: Secondary | ICD-10-CM | POA: Diagnosis not present

## 2016-09-26 DIAGNOSIS — Z79899 Other long term (current) drug therapy: Secondary | ICD-10-CM

## 2016-09-26 LAB — CBC
HCT: 45.2 % (ref 39.0–52.0)
HEMOGLOBIN: 15.5 g/dL (ref 13.0–17.0)
MCH: 29.4 pg (ref 26.0–34.0)
MCHC: 34.3 g/dL (ref 30.0–36.0)
MCV: 85.8 fL (ref 78.0–100.0)
Platelets: 212 10*3/uL (ref 150–400)
RBC: 5.27 MIL/uL (ref 4.22–5.81)
RDW: 13.6 % (ref 11.5–15.5)
WBC: 4.8 10*3/uL (ref 4.0–10.5)

## 2016-09-26 LAB — BASIC METABOLIC PANEL
ANION GAP: 9 (ref 5–15)
BUN: 9 mg/dL (ref 6–20)
CALCIUM: 9 mg/dL (ref 8.9–10.3)
CO2: 24 mmol/L (ref 22–32)
Chloride: 105 mmol/L (ref 101–111)
Creatinine, Ser: 0.88 mg/dL (ref 0.61–1.24)
Glucose, Bld: 105 mg/dL — ABNORMAL HIGH (ref 65–99)
Potassium: 3.7 mmol/L (ref 3.5–5.1)
Sodium: 138 mmol/L (ref 135–145)

## 2016-09-26 LAB — I-STAT TROPONIN, ED: TROPONIN I, POC: 0 ng/mL (ref 0.00–0.08)

## 2016-09-26 NOTE — ED Triage Notes (Signed)
Pt states he was seen 3 days ago on the ED and sent home with PO abx for PNA, pt states he still having persistent cough and heaviness on his chest, pt unable to tolerate his abx due to nausea and upset stomach.

## 2016-09-27 ENCOUNTER — Inpatient Hospital Stay (HOSPITAL_COMMUNITY)
Admission: EM | Admit: 2016-09-27 | Discharge: 2016-09-29 | DRG: 195 | Disposition: A | Discharge status: Home / Self Care | Payer: BLUE CROSS/BLUE SHIELD | Attending: Internal Medicine | Admitting: Internal Medicine

## 2016-09-27 ENCOUNTER — Inpatient Hospital Stay (HOSPITAL_COMMUNITY): Payer: BLUE CROSS/BLUE SHIELD

## 2016-09-27 ENCOUNTER — Encounter (HOSPITAL_COMMUNITY): Payer: Self-pay | Admitting: Family Medicine

## 2016-09-27 DIAGNOSIS — Z7982 Long term (current) use of aspirin: Secondary | ICD-10-CM | POA: Diagnosis not present

## 2016-09-27 DIAGNOSIS — R0602 Shortness of breath: Secondary | ICD-10-CM

## 2016-09-27 DIAGNOSIS — D72819 Decreased white blood cell count, unspecified: Secondary | ICD-10-CM | POA: Diagnosis not present

## 2016-09-27 DIAGNOSIS — R1013 Epigastric pain: Secondary | ICD-10-CM

## 2016-09-27 DIAGNOSIS — I2510 Atherosclerotic heart disease of native coronary artery without angina pectoris: Secondary | ICD-10-CM | POA: Diagnosis not present

## 2016-09-27 DIAGNOSIS — J180 Bronchopneumonia, unspecified organism: Secondary | ICD-10-CM

## 2016-09-27 DIAGNOSIS — R0600 Dyspnea, unspecified: Secondary | ICD-10-CM

## 2016-09-27 DIAGNOSIS — R112 Nausea with vomiting, unspecified: Secondary | ICD-10-CM | POA: Diagnosis not present

## 2016-09-27 DIAGNOSIS — Z79899 Other long term (current) drug therapy: Secondary | ICD-10-CM | POA: Diagnosis not present

## 2016-09-27 DIAGNOSIS — J189 Pneumonia, unspecified organism: Secondary | ICD-10-CM | POA: Diagnosis not present

## 2016-09-27 DIAGNOSIS — Z09 Encounter for follow-up examination after completed treatment for conditions other than malignant neoplasm: Secondary | ICD-10-CM

## 2016-09-27 DIAGNOSIS — Z8249 Family history of ischemic heart disease and other diseases of the circulatory system: Secondary | ICD-10-CM | POA: Diagnosis not present

## 2016-09-27 DIAGNOSIS — I10 Essential (primary) hypertension: Secondary | ICD-10-CM | POA: Diagnosis not present

## 2016-09-27 DIAGNOSIS — R05 Cough: Secondary | ICD-10-CM | POA: Diagnosis not present

## 2016-09-27 DIAGNOSIS — Z9861 Coronary angioplasty status: Secondary | ICD-10-CM | POA: Diagnosis not present

## 2016-09-27 DIAGNOSIS — I2583 Coronary atherosclerosis due to lipid rich plaque: Secondary | ICD-10-CM | POA: Diagnosis not present

## 2016-09-27 LAB — RESPIRATORY PANEL BY PCR
Adenovirus: NOT DETECTED
Bordetella pertussis: NOT DETECTED
CORONAVIRUS 229E-RVPPCR: NOT DETECTED
CORONAVIRUS HKU1-RVPPCR: NOT DETECTED
CORONAVIRUS OC43-RVPPCR: NOT DETECTED
Chlamydophila pneumoniae: NOT DETECTED
Coronavirus NL63: NOT DETECTED
Influenza A: NOT DETECTED
Influenza B: NOT DETECTED
METAPNEUMOVIRUS-RVPPCR: NOT DETECTED
MYCOPLASMA PNEUMONIAE-RVPPCR: NOT DETECTED
PARAINFLUENZA VIRUS 1-RVPPCR: NOT DETECTED
PARAINFLUENZA VIRUS 2-RVPPCR: NOT DETECTED
Parainfluenza Virus 3: NOT DETECTED
Parainfluenza Virus 4: NOT DETECTED
Respiratory Syncytial Virus: NOT DETECTED
Rhinovirus / Enterovirus: NOT DETECTED

## 2016-09-27 LAB — DIFFERENTIAL
BASOS PCT: 0 %
Basophils Absolute: 0 10*3/uL (ref 0.0–0.1)
EOS PCT: 1 %
Eosinophils Absolute: 0 10*3/uL (ref 0.0–0.7)
LYMPHS ABS: 1.8 10*3/uL (ref 0.7–4.0)
Lymphocytes Relative: 39 %
Monocytes Absolute: 0.6 10*3/uL (ref 0.1–1.0)
Monocytes Relative: 13 %
NEUTROS PCT: 47 %
Neutro Abs: 2.1 10*3/uL (ref 1.7–7.7)

## 2016-09-27 LAB — LIPASE, BLOOD: Lipase: 18 U/L (ref 11–51)

## 2016-09-27 LAB — PROCALCITONIN

## 2016-09-27 LAB — HEPATIC FUNCTION PANEL
ALT: 54 U/L (ref 17–63)
AST: 65 U/L — ABNORMAL HIGH (ref 15–41)
Albumin: 3.6 g/dL (ref 3.5–5.0)
Alkaline Phosphatase: 55 U/L (ref 38–126)
BILIRUBIN DIRECT: 0.3 mg/dL (ref 0.1–0.5)
BILIRUBIN TOTAL: 1 mg/dL (ref 0.3–1.2)
Indirect Bilirubin: 0.7 mg/dL (ref 0.3–0.9)
Total Protein: 7.2 g/dL (ref 6.5–8.1)

## 2016-09-27 LAB — HIV ANTIBODY (ROUTINE TESTING W REFLEX): HIV Screen 4th Generation wRfx: NONREACTIVE

## 2016-09-27 LAB — D-DIMER, QUANTITATIVE (NOT AT ARMC): D DIMER QUANT: 0.84 ug{FEU}/mL — AB (ref 0.00–0.50)

## 2016-09-27 LAB — I-STAT CG4 LACTIC ACID, ED: Lactic Acid, Venous: 1.54 mmol/L (ref 0.5–1.9)

## 2016-09-27 LAB — STREP PNEUMONIAE URINARY ANTIGEN: Strep Pneumo Urinary Antigen: NEGATIVE

## 2016-09-27 LAB — BRAIN NATRIURETIC PEPTIDE: B Natriuretic Peptide: 31.5 pg/mL (ref 0.0–100.0)

## 2016-09-27 MED ORDER — ALBUTEROL SULFATE (2.5 MG/3ML) 0.083% IN NEBU
5.0000 mg | INHALATION_SOLUTION | Freq: Once | RESPIRATORY_TRACT | Status: AC
  Administered 2016-09-27: 5 mg via RESPIRATORY_TRACT
  Filled 2016-09-27: qty 6

## 2016-09-27 MED ORDER — IPRATROPIUM-ALBUTEROL 0.5-2.5 (3) MG/3ML IN SOLN
3.0000 mL | Freq: Four times a day (QID) | RESPIRATORY_TRACT | Status: DC
  Administered 2016-09-27: 3 mL via RESPIRATORY_TRACT
  Filled 2016-09-27: qty 3

## 2016-09-27 MED ORDER — ALBUTEROL SULFATE (2.5 MG/3ML) 0.083% IN NEBU: 2.5000 mg | INHALATION_SOLUTION | RESPIRATORY_TRACT | Status: DC | PRN

## 2016-09-27 MED ORDER — IPRATROPIUM BROMIDE 0.02 % IN SOLN
0.5000 mg | Freq: Once | RESPIRATORY_TRACT | Status: AC
  Administered 2016-09-27: 0.5 mg via RESPIRATORY_TRACT
  Filled 2016-09-27: qty 2.5

## 2016-09-27 MED ORDER — SODIUM CHLORIDE 0.9 % IV SOLN
INTRAVENOUS | Status: DC
  Administered 2016-09-27 – 2016-09-28 (×3): via INTRAVENOUS

## 2016-09-27 MED ORDER — DEXTROSE 5 % IV SOLN
1.0000 g | Freq: Once | INTRAVENOUS | Status: AC
  Administered 2016-09-27: 1 g via INTRAVENOUS
  Filled 2016-09-27: qty 10

## 2016-09-27 MED ORDER — FAMOTIDINE IN NACL 20-0.9 MG/50ML-% IV SOLN
20.0000 mg | Freq: Once | INTRAVENOUS | Status: AC
Start: 2016-09-27 — End: 2016-09-27
  Administered 2016-09-27: 20 mg via INTRAVENOUS
  Filled 2016-09-27: qty 50

## 2016-09-27 MED ORDER — ROSUVASTATIN CALCIUM 5 MG PO TABS
5.0000 mg | ORAL_TABLET | Freq: Every day | ORAL | Status: DC
  Administered 2016-09-27 – 2016-09-29 (×3): 5 mg via ORAL
  Filled 2016-09-27 (×3): qty 1

## 2016-09-27 MED ORDER — CEFTRIAXONE SODIUM 1 G IJ SOLR
1.0000 g | INTRAMUSCULAR | Status: DC
  Administered 2016-09-27 – 2016-09-28 (×2): 1 g via INTRAVENOUS
  Filled 2016-09-27 (×3): qty 10

## 2016-09-27 MED ORDER — GUAIFENESIN-DM 100-10 MG/5ML PO SYRP: 5.0000 mL | ORAL_SOLUTION | ORAL | Status: DC | PRN

## 2016-09-27 MED ORDER — DEXTROSE 5 % IV SOLN: 1.0000 g | INTRAVENOUS | Status: DC

## 2016-09-27 MED ORDER — IOPAMIDOL (ISOVUE-370) INJECTION 76%
INTRAVENOUS | Status: AC
  Administered 2016-09-27: 100 mL
  Filled 2016-09-27: qty 100

## 2016-09-27 MED ORDER — DEXTROSE 5 % IV SOLN
500.0000 mg | INTRAVENOUS | Status: DC
  Administered 2016-09-28 – 2016-09-29 (×2): 500 mg via INTRAVENOUS
  Filled 2016-09-27 (×3): qty 500

## 2016-09-27 MED ORDER — AZITHROMYCIN 500 MG IV SOLR
500.0000 mg | Freq: Once | INTRAVENOUS | Status: AC
  Administered 2016-09-27: 500 mg via INTRAVENOUS
  Filled 2016-09-27: qty 500

## 2016-09-27 MED ORDER — LISINOPRIL 10 MG PO TABS
5.0000 mg | ORAL_TABLET | Freq: Every day | ORAL | Status: DC
Start: 2016-09-27 — End: 2016-09-29
  Administered 2016-09-27 – 2016-09-29 (×3): 5 mg via ORAL
  Filled 2016-09-27 (×3): qty 1

## 2016-09-27 MED ORDER — ONDANSETRON HCL 4 MG PO TABS: 4.0000 mg | ORAL_TABLET | Freq: Four times a day (QID) | ORAL | Status: DC | PRN

## 2016-09-27 MED ORDER — GI COCKTAIL ~~LOC~~
30.0000 mL | Freq: Once | ORAL | Status: AC
  Administered 2016-09-27: 30 mL via ORAL
  Filled 2016-09-27: qty 30

## 2016-09-27 MED ORDER — ASPIRIN EC 81 MG PO TBEC
81.0000 mg | DELAYED_RELEASE_TABLET | Freq: Every day | ORAL | Status: DC
  Administered 2016-09-27 – 2016-09-29 (×3): 81 mg via ORAL
  Filled 2016-09-27 (×3): qty 1

## 2016-09-27 MED ORDER — TICAGRELOR 60 MG PO TABS
60.0000 mg | ORAL_TABLET | Freq: Two times a day (BID) | ORAL | Status: DC
  Administered 2016-09-27 – 2016-09-29 (×5): 60 mg via ORAL
  Filled 2016-09-27 (×6): qty 1

## 2016-09-27 MED ORDER — NEBIVOLOL HCL 2.5 MG PO TABS
2.5000 mg | ORAL_TABLET | Freq: Every day | ORAL | Status: DC
  Administered 2016-09-28 – 2016-09-29 (×2): 2.5 mg via ORAL
  Filled 2016-09-27 (×3): qty 1

## 2016-09-27 MED ORDER — ONDANSETRON HCL 4 MG/2ML IJ SOLN
4.0000 mg | Freq: Once | INTRAMUSCULAR | Status: AC
Start: 2016-09-27 — End: 2016-09-27
  Administered 2016-09-27: 4 mg via INTRAVENOUS
  Filled 2016-09-27: qty 2

## 2016-09-27 MED ORDER — SODIUM CHLORIDE 0.9 % IV BOLUS (SEPSIS)
1000.0000 mL | Freq: Once | INTRAVENOUS | Status: AC
  Administered 2016-09-27: 1000 mL via INTRAVENOUS

## 2016-09-27 MED ORDER — ACETAMINOPHEN 650 MG RE SUPP: 650.0000 mg | Freq: Four times a day (QID) | RECTAL | Status: DC | PRN

## 2016-09-27 MED ORDER — ONDANSETRON HCL 4 MG/2ML IJ SOLN: 4.0000 mg | Freq: Four times a day (QID) | INTRAMUSCULAR | Status: DC | PRN

## 2016-09-27 MED ORDER — ENOXAPARIN SODIUM 40 MG/0.4ML ~~LOC~~ SOLN
40.0000 mg | Freq: Every day | SUBCUTANEOUS | Status: DC
  Administered 2016-09-27 – 2016-09-28 (×2): 40 mg via SUBCUTANEOUS
  Filled 2016-09-27 (×3): qty 0.4

## 2016-09-27 MED ORDER — ACETAMINOPHEN 325 MG PO TABS
650.0000 mg | ORAL_TABLET | Freq: Four times a day (QID) | ORAL | Status: DC | PRN
  Administered 2016-09-28: 650 mg via ORAL
  Filled 2016-09-27: qty 2

## 2016-09-27 NOTE — ED Provider Notes (Signed)
[facility]-EMERGENCY DEPT Provider Note   CSN: [id number] Arrival date & time: 09/26/16  2046     History   Chief Complaint Chief Complaint  Patient presents with  . Shortness of Breath    HPI Joshua Phillips is a 56 y.o. male with a PMHx of HTN and CAD s/p distal RCA DES in 2011 (followed by Dr. Jacinto HalimGanji), who presents to the ED with complaints of gradually worsening cough x9 days, worse over the last 4 days. Patient states that he came back from [place] 09/17/16 ([place] and had a sore throat, fevers, cough, body aches; he was seen here on 09/23/16 for cough, fevers, body aches x6 days prior to ED visit; had just come back from [place], +Sick contacts; had labs that revealed WBC 3.2 with increased bands, lactic negative x2, flu test negative, CXR showing patchy b/l PNA; was discharged on levaquin. He states that since leaving, he states developed gradually worsening chest heaviness described as 9/10 intermittent nonradiating anterior chest heaviness worse with activity and improved with Tylenol (last at 2pm) and Advil (last at 8pm). He also reports associated worsening wheezing, continued cough with yellow sputum production, dyspnea on exertion and shortness of breath, fatigue, subjective fevers and chills, and lightheadedness. He has not been able to tolerate the Levaquin due to upset stomach and nausea, he has vomited once which was nonbloody and nonbilious that occurred yesterday after taking antibiotics. He has developed some mild epigastric abdominal burning. He states he has not had an appetite and has been eating less, feels like he is not improving and gradually worsening since his last ED visit. He has not taken his Levaquin today due to his upset stomach and nausea. He returned to the ED for further evaluation of these complaints. He is a nonsmoker. +Sick contacts- wife with similar symptoms.  He denies rhinorrhea, ear pain/drainage, ongoing sore throat, hemoptysis, LE  swelling, diaphoresis, claudication, orthopnea, hematemesis, hematochezia, melena, diarrhea, constipation, obstipation, hematuria, dysuria, arthralgias, numbness, tingling, focal weakness, or any other complaints at this time. No recent hospital stays or nursing home exposures prior to onset of illness. No PCP.    The history is provided by the patient and medical records. No language interpreter was used.  Shortness of Breath  Associated symptoms include a fever, cough, wheezing, chest pain, vomiting and abdominal pain. Pertinent negatives include no rhinorrhea, no sore throat, no ear pain and no leg swelling. Associated medical issues include pneumonia.  Cough  This is a new problem. The current episode started more than 1 week ago. The problem occurs constantly. The problem has been gradually worsening. The cough is productive of sputum. Maximum temperature: subjective. The fever has been present for 5 days or more. Associated symptoms include chest pain, chills, shortness of breath and wheezing. Pertinent negatives include no ear pain, no rhinorrhea, no sore throat and no myalgias. Treatments tried: tylenol and motrin. The treatment provided mild relief. He is not a smoker. His past medical history is significant for pneumonia.    Past Medical History:  Diagnosis Date  . Hypertension     There are no active problems to display for this patient.   Past Surgical History:  Procedure Laterality Date  . CARDIAC SURGERY     stent placement       Home Medications    Prior to Admission medications   Medication Sig Start Date End Date Taking? Authorizing Provider  aspirin EC 81 MG tablet Take 81 mg  by mouth daily.    Historical Provider, MD  BELVIQ 10 MG TABS Take 10 mg by mouth 2 (two) times daily. 09/09/16   Historical Provider, MD  BRILINTA 60 MG TABS tablet Take 60 mg by mouth 2 (two) times daily. 08/26/16   Historical Provider, MD  Cholecalciferol (VITAMIN D-3 PO) Take 1 capsule by  mouth daily.    Historical Provider, MD  Coenzyme Q10 (CO Q-10 PO) Take 1 capsule by mouth daily.    Historical Provider, MD  CRESTOR 10 MG tablet Take 5 mg by mouth daily. 08/24/16   Historical Provider, MD  HYDROcodone-homatropine (HYCODAN) 5-1.5 MG/5ML syrup Take 5 mLs by mouth every 6 (six) hours as needed for cough. 09/23/16   Bethel Born, PA-C  ibuprofen (ADVIL,MOTRIN) 200 MG tablet Take 200-400 mg by mouth every 6 (six) hours as needed (for pain, fever, or headaches).    Historical Provider, MD  levofloxacin (LEVAQUIN) 750 MG tablet Take 1 tablet (750 mg total) by mouth daily. 09/23/16   Bethel Born, PA-C  lisinopril (PRINIVIL,ZESTRIL) 5 MG tablet Take 5 mg by mouth daily. 08/24/16   Historical Provider, MD  metoprolol succinate (TOPROL-XL) 25 MG 24 hr tablet Take 25 mg by mouth daily. 09/18/16   Historical Provider, MD  Omega-3 Fatty Acids (FISH OIL PO) Take 1 capsule by mouth daily.    Historical Provider, MD    Family History No family history on file.  Social History Social History  Substance Use Topics  . Smoking status: Never Smoker  . Smokeless tobacco: Never Used  . Alcohol use Yes     Comment: occ     Allergies   Patient has no known allergies.   Review of Systems Review of Systems  Constitutional: Positive for chills, fatigue and fever. Negative for diaphoresis.  HENT: Negative for ear discharge, ear pain, rhinorrhea and sore throat.   Respiratory: Positive for cough, shortness of breath and wheezing.   Cardiovascular: Positive for chest pain. Negative for leg swelling.  Gastrointestinal: Positive for abdominal pain, nausea and vomiting. Negative for blood in stool, constipation and diarrhea.  Genitourinary: Negative for dysuria and hematuria.  Musculoskeletal: Negative for arthralgias and myalgias.  Skin: Negative for color change.  Allergic/Immunologic: Negative for immunocompromised state.  Neurological: Positive for light-headedness. Negative for  weakness and numbness.  Psychiatric/Behavioral: Negative for confusion.   10 Systems reviewed and are negative for acute change except as noted in the HPI.   Physical Exam Updated Vital Signs BP 109/78 (BP Location: Left Arm)   Pulse 73   Temp 97.4 F (36.3 C) (Oral)   Resp 19   SpO2 93%   Physical Exam  Constitutional: He is oriented to person, place, and time. Vital signs are normal. He appears well-developed and well-nourished.  Non-toxic appearance. No distress.  Afebrile, nontoxic, NAD although appears tired  HENT:  Head: Normocephalic and atraumatic.  Mouth/Throat: Oropharynx is clear and moist and mucous membranes are normal.  Eyes: Conjunctivae and EOM are normal. Right eye exhibits no discharge. Left eye exhibits no discharge.  Neck: Normal range of motion. Neck supple.  Cardiovascular: Normal rate, regular rhythm, normal heart sounds and intact distal pulses.  Exam reveals no gallop and no friction rub.   No murmur heard. RRR, nl s1/s2, no m/r/g, distal pulses intact, no pedal edema   Pulmonary/Chest: Effort normal. No respiratory distress. He has decreased breath sounds. He has no wheezes. He has no rhonchi. He has no rales. He exhibits tenderness. He exhibits  no crepitus, no deformity and no retraction.  Diminished lung sounds throughout, although no audible w/r/r, no hypoxia or increased WOB, speaking in full sentences, SpO2 96% on RA Chest wall with mild diffuse anterior TTP without crepitus, deformities, or retractions   Abdominal: Soft. Normal appearance and bowel sounds are normal. He exhibits no distension. There is tenderness in the epigastric area. There is no rigidity, no rebound, no guarding, no CVA tenderness, no tenderness at McBurney's point and negative Murphy's sign.  Soft, nondistended, +BS throughout, with mild epigastric TTP, no r/g/r, neg murphy's, neg mcburney's, no CVA TTP   Musculoskeletal: Normal range of motion.  MAE x4 Strength and sensation  grossly intact in all extremities Distal pulses intact Gait steady No pedal edema, neg homan's bilaterally   Neurological: He is alert and oriented to person, place, and time. He has normal strength. No sensory deficit.  Skin: Skin is warm, dry and intact. No rash noted.  Psychiatric: He has a normal mood and affect.  Nursing note and vitals reviewed.    ED Treatments / Results  Labs (all labs ordered are listed, but only abnormal results are displayed) Labs Reviewed  BASIC METABOLIC PANEL - Abnormal; Notable for the following:       Result Value   Glucose, Bld 105 (*)    All other components within normal limits  HEPATIC FUNCTION PANEL - Abnormal; Notable for the following:    AST 65 (*)    All other components within normal limits  CBC  LIPASE, BLOOD  DIFFERENTIAL  I-STAT TROPOININ, ED  I-STAT CG4 LACTIC ACID, ED    EKG  EKG Interpretation  Date/Time:  Thursday September 26 2016 20:52:23 EDT Ventricular Rate:  79 PR Interval:  136 QRS Duration: 78 QT Interval:  414 QTC Calculation: 474 R Axis:   79 Text Interpretation:  Normal sinus rhythm Normal ECG No significant change since last tracing Confirmed by WARD,  DO, KRISTEN [id number]) on 09/27/2016 2:16:35 AM       Radiology Dg Chest 2 View  Result Date: 09/26/2016 CLINICAL DATA:  Back persistent cough and chest heaviness. Unable to tolerate antibiotics due to nausea. EXAM: CHEST  2 VIEW COMPARISON:  Chest radiograph September 23, 2016 FINDINGS: Cardiomediastinal silhouette is unremarkable and unchanged. Diffuse interstitial prominence with increasing bibasilar and RIGHT mid lung zone airspace opacities. No pleural effusion. No pneumothorax. Soft tissue planes and included osseous structures are nonsuspicious. IMPRESSION: Worsening bronchopneumonia. Followup PA and lateral chest X-ray is recommended in 3-4 weeks following trial of antibiotic therapy to ensure resolution and exclude underlying malignancy. Electronically Signed   By:  Awilda Metro M.D.   On: 09/26/2016 22:14    Procedures Procedures (including critical care time)  Medications Ordered in ED Medications  azithromycin (ZITHROMAX) 500 mg in dextrose 5 % 250 mL IVPB (500 mg Intravenous New Bag/Given 09/27/16 0309)  albuterol (PROVENTIL) (2.5 MG/3ML) 0.083% nebulizer solution 5 mg (not administered)  ipratropium (ATROVENT) nebulizer solution 0.5 mg (not administered)  albuterol (PROVENTIL) (2.5 MG/3ML) 0.083% nebulizer solution 5 mg (5 mg Nebulization Given 09/27/16 0216)  ipratropium (ATROVENT) nebulizer solution 0.5 mg (0.5 mg Nebulization Given 09/27/16 0216)  ondansetron (ZOFRAN) injection 4 mg (4 mg Intravenous Given 09/27/16 0216)  sodium chloride 0.9 % bolus 1,000 mL (0 mLs Intravenous Stopped 09/27/16 0309)  cefTRIAXone (ROCEPHIN) 1 g in dextrose 5 % 50 mL IVPB (0 g Intravenous Stopped 09/27/16 0252)  famotidine (PEPCID) IVPB 20 mg premix (0 mg Intravenous Stopped 09/27/16 0312)  gi cocktail (  Maalox,Lidocaine,Donnatal) (30 mLs Oral Given 09/27/16 0217)     Initial Impression / Assessment and Plan / ED Course  I have reviewed the triage vital signs and the nursing notes.  Pertinent labs & imaging results that were available during my care of the patient were reviewed by me and considered in my medical decision making (see chart for details).     56 y.o. male here with worsening cough, chest heaviness, SOB/DOE, cough, wheezing, fever/chills, and fatigue x9 days worse over the last 4 days; was seen in the ED 09/23/16 and diagnosed with PNA, sent home on levaquin; feels like he's getting worse, chest heaviness and DOE and lightheadedness started since last visit; not tolerating Levaquin at home, yesterday had episode of vomiting, and having epigastric abd pain as well. On exam, diminished lung sounds throughout, no w/r/r; mild epigastric TTP, nonperitoneal, neg murphy's; no tachycardia or hypoxia, no LE swelling. Trop neg, EKG unremarkable, CBC with improved  WBC from last visit, will add-on differential; BMP WNL. CXR with worsening bronchopneumonia. Likely will need admission given that he's failing outpatient therapies, but will obtain LFTs, lipase, lactic, and add-on differential, and give duoneb, zofran, fluids, rocephin/azithromax, pepcid, and GI cocktail to see if we can improve symptoms. Will reassess shortly. Doubt concern for TB, however given worsening on levaquin, I'm concerned for potential atypical bugs; Discussed case with my attending Dr. Elesa Massed who agrees with plan.   3:18 AM LFTs with mildly elevated AST 65 but otherwise unremarkable. Lipase WNL. Lactic WNL. Differential with neutrophils 47% which is mildly low, but otherwise unremarkable. Pt feeling same as previously, and lung sounds not much better after breathing tx; will repeat now; seems to be coughing more productively now after the first tx so hopefully it's helped slightly. Will proceed with admission for failed outpatient management of CAP.   3:35 AM Dr. Maryfrances Bunnell of [facility] returning page and will admit. Holding orders to be placed by admitting team. Please see their notes for further documentation of care. I appreciate their help with this pleasant pt's care. Pt stable at time of admission.   Final Clinical Impressions(s) / ED Diagnoses   Final diagnoses:  Bronchopneumonia  Shortness of breath  Epigastric abdominal pain  Nausea and vomiting in adult patient    New Prescriptions New Prescriptions   No medications on file     [address] PA-C 09/27/16 0336    Layla Maw Ward, DO 09/27/16 0403

## 2016-09-27 NOTE — ED Notes (Signed)
Admitting MD at the bedside.  

## 2016-09-27 NOTE — Progress Notes (Signed)
Pt seen and examined at bedside, admitted after midnight, please see earlier admission note by Dr. Maryfrances Bunnellanford. Pt admitted for management of PNA. Started on Rocephin and Zithromax. D-dimer elevated, CT chest angio requested, no PE on CT scan, d/w PCCM on call, agree with current ABX and supportive care with bronchodilators as needed and antitussives as needed. Pt will need close outpatient follow up with pulmonologist within few weeks after pt discharged. Will arrange this follow up when pt is ready for discharge. I suspect pt will needs at least 2-3 days inpatient stay. Pt also with new onset diarrhea. Will ask for stool panel and C. Diff by PCR.   Debbora PrestoMAGICK-Shelton Soler, MD  [facility] Pager [phone]  If 7PM-7AM, please contact night-coverage [email or web address] Password [email or web address]

## 2016-09-27 NOTE — H&P (Signed)
History and Physical  Patient Name: Joshua Phillips     ZOX:[id number]    DOB: 28-May-1961    DOA: 09/27/2016 PCP: Ricki Rodriguez, MD   Patient coming from: Home  Chief Complaint: Cough  HPI: Joshua Phillips is a 56 y.o. male with a past medical history significant for CAD s/p PCI who presents with pneumonia failed outpatient therapy.  The patient was in his usual state of health until the beginning of this month, he flew to [place] for a one week work trip (he is a Education officer, community) to [place].  The first day there he developed typical URI symptoms of coryza, sore throat, myalgias, fever.  He sought care after a few days and was prescribed ibuprofen, decongestants and cetirizine which did not help.  His cough, sputum production and fever progressively worsened and so about 3 days ago when he got back to [facility] he was seen in the ER here, had no fever recorded, but leukopenia with >20% bands, new infiltrates bilaterally on CXR and was prescribed Levaquin for CAP and discharged.  He thinks he took about two doses of Levaqin, but then started to vomit because of it and had to stop yesterday.  His fever improved, but overall his cough worsened, he felt a new "heaviness" in the chest, and rattling when he sleeps, so he came back to the ER.  ED course: -Afebrile, heart rate 77, respirations and pulse ox normal, BP 111/79 -Na 138, K 3.7, Cr 0.88, WBC 4.8K, Hgb 15.5 -Lipase normal, LFTs showed only minimally elevated AST -Troponin negative -Lactate normal -CXR showed worsening bilateral infiltrates  -He was given ceftriaxone, azithromycin, 1 L normal saline and [facility] was asked to admit for pneumonia failed outpatient therapy     ROS: Review of Systems  Constitutional: Positive for fever and malaise/fatigue.  Respiratory: Positive for cough, sputum production and shortness of breath. Negative for hemoptysis.   Cardiovascular: Positive for orthopnea. Negative for leg swelling and PND.  Gastrointestinal:  Positive for heartburn.  Musculoskeletal: Positive for myalgias.  All other systems reviewed and are negative.         Past Medical History:  Diagnosis Date  . Hypertension     Past Surgical History:  Procedure Laterality Date  . CARDIAC SURGERY     stent placement    Social History: The patient walks unassisted and is independent with all ADLs.  He does not smoke.  He works in hotels.    No Known Allergies  Family history: family history includes Heart attack in his father.  Prior to Admission medications   Medication Sig Start Date End Date Taking? Authorizing Provider  aspirin EC 81 MG tablet Take 81 mg by mouth daily.   Yes Historical Provider, MD  BELVIQ 10 MG TABS Take 10 mg by mouth 2 (two) times daily. 09/09/16  Yes Historical Provider, MD  BRILINTA 60 MG TABS tablet Take 60 mg by mouth 2 (two) times daily. 08/26/16  Yes Historical Provider, MD  Cholecalciferol (VITAMIN D-3 PO) Take 1 capsule by mouth daily.   Yes Historical Provider, MD  Coenzyme Q10 (CO Q-10 PO) Take 1 capsule by mouth daily.   Yes Historical Provider, MD  CRESTOR 10 MG tablet Take 5 mg by mouth daily. 08/24/16  Yes Historical Provider, MD  HYDROcodone-homatropine (HYCODAN) 5-1.5 MG/5ML syrup Take 5 mLs by mouth every 6 (six) hours as needed for cough. 09/23/16  Yes Bethel Born, PA-C  ibuprofen (ADVIL,MOTRIN) 200 MG tablet Take 200-400 mg by mouth  every 6 (six) hours as needed (for pain, fever, or headaches).   Yes Historical Provider, MD  lisinopril (PRINIVIL,ZESTRIL) 5 MG tablet Take 5 mg by mouth daily. 08/24/16  Yes Historical Provider, MD  metoprolol succinate (TOPROL-XL) 25 MG 24 hr tablet Take 25 mg by mouth daily. 09/18/16  Yes Historical Provider, MD  Omega-3 Fatty Acids (FISH OIL PO) Take 1 capsule by mouth daily.   Yes Historical Provider, MD       Physical Exam: BP 108/69   Pulse 82   Temp 98.2 F (36.8 C) (Oral)   Resp 18   SpO2 94%  General appearance: Well-developed, adult  male, alert and in no acute distress.   Eyes: Anicteric, conjunctiva pink, lids and lashes normal. PERRL.    ENT: No nasal deformity, discharge, epistaxis.  Hearing normal. OP moist without lesions.   Neck: No neck masses.  Trachea midline.  No thyromegaly/tenderness. Lymph: No cervical or supraclavicular lymphadenopathy. Skin: Warm and dry.  No jaundice.  No suspicious rashes or lesions. Cardiac: RRR, nl S1-S2, no murmurs appreciated.  Capillary refill is brisk.  JVP normal.  No LE edema.  Radial and DP pulses 2+ and symmetric. Respiratory: Normal respiratory rate and rhythm.  Rales bilaterally.  No wheezing. Abdomen: Abdomen soft.  No TTP. No ascites, distension, hepatosplenomegaly.   MSK: No deformities or effusions.  No cyanosis or clubbing. Neuro: Cranial nerves normal.  Sensation intact to light touch. Speech is fluent.  Muscle strength normal.    Psych: Sensorium intact and responding to questions, attention normal.  Behavior appropriate.  Affect normal.  Judgment and insight appear normal.     Labs on Admission:  I have personally reviewed following labs and imaging studies: CBC:  Recent Labs Lab 09/23/16 1224 09/26/16 2113  WBC 3.2* 4.8  NEUTROABS 2.1 2.1  HGB 14.6 15.5  HCT 41.8 45.2  MCV 86.4 85.8  PLT 132* 212   Basic Metabolic Panel:  Recent Labs Lab 09/23/16 1224 09/26/16 2113  NA 133* 138  K 3.5 3.7  CL 102 105  CO2 22 24  GLUCOSE 140* 105*  BUN 12 9  CREATININE 1.21 0.88  CALCIUM 8.3* 9.0   GFR: Estimated Creatinine Clearance: 103.7 mL/min (by C-G formula based on SCr of 0.88 mg/dL).  Liver Function Tests:  Recent Labs Lab 09/23/16 1224 09/26/16 2113  AST 99* 65*  ALT 68* 54  ALKPHOS 49 55  BILITOT 0.7 1.0  PROT 6.7 7.2  ALBUMIN 3.7 3.6    Recent Labs Lab 09/26/16 2113  LIPASE 18   No results for input(s): AMMONIA in the last 168 hours. Coagulation Profile: No results for input(s): INR, PROTIME in the last 168 hours. Cardiac  Enzymes: No results for input(s): CKTOTAL, CKMB, CKMBINDEX, TROPONINI in the last 168 hours. BNP (last 3 results) No results for input(s): PROBNP in the last 8760 hours. HbA1C: No results for input(s): HGBA1C in the last 72 hours. CBG: No results for input(s): GLUCAP in the last 168 hours. Lipid Profile: No results for input(s): CHOL, HDL, LDLCALC, TRIG, CHOLHDL, LDLDIRECT in the last 72 hours. Thyroid Function Tests: No results for input(s): TSH, T4TOTAL, FREET4, T3FREE, THYROIDAB in the last 72 hours. Anemia Panel: No results for input(s): VITAMINB12, FOLATE, FERRITIN, TIBC, IRON, RETICCTPCT in the last 72 hours. Sepsis Labs: Lactic acid 1.54 Invalid input(s): PROCALCITONIN, LACTICIDVEN No results found for this or any previous visit (from the past 240 hour(s)).       Radiological Exams on Admission: Personally reviewed  CXR shows bilateral patchy opacities in bases and ever so slightly increased opacities in left Dg Chest 2 View  Result Date: 09/26/2016 CLINICAL DATA:  Back persistent cough and chest heaviness. Unable to tolerate antibiotics due to nausea. EXAM: CHEST  2 VIEW COMPARISON:  Chest radiograph September 23, 2016 FINDINGS: Cardiomediastinal silhouette is unremarkable and unchanged. Diffuse interstitial prominence with increasing bibasilar and RIGHT mid lung zone airspace opacities. No pleural effusion. No pneumothorax. Soft tissue planes and included osseous structures are nonsuspicious. IMPRESSION: Worsening bronchopneumonia. Followup PA and lateral chest X-ray is recommended in 3-4 weeks following trial of antibiotic therapy to ensure resolution and exclude underlying malignancy. Electronically Signed   By: Awilda Metroourtnay  Bloomer M.D.   On: 09/26/2016 22:14    EKG: Independently reviewed. Rate 79, QTc 474.  No ST changes.    Assessment/Plan  1. Pneumonia, bilateral lower lobes:  Suspect pneumonia failed outpatient therapy.  Other considerations include CHF, second respiratory  virus.  Doubt TB. Will obtain BNP and procalcitonin.   -Ceftriaxone and azithromycin -Sputum culture ordered -HIV testing -Strep pneumo urinary antigen   2. Coronary artery disease secondary prevention:  -Continue aspirin, statin, Brilinta, ACEi -Nebivolol for metoprolol while in the hospital given respiratory infection          DVT prophylaxis: Lovenox  Code Status: FULL  Family Communication: None present  Disposition Plan: Anticipate IV fluids and IV antibiotics and monitor for improvement Consults called: None Admission status: INPATIENT         Medical decision making: Patient seen at 4:10 AM on 09/27/2016.  The patient was discussed with [address].  What exists of the patient's chart was reviewed in depth and summarized above.  Clinical condition: stable.        Alberteen SamChristopher P Luba Matzen [facility] Pager [phone]       At the time of admission, it appears that the appropriate admission status for this patient is INPATIENT. This is judged to be reasonable and necessary in order to provide the required intensity of service to ensure the patient's safety given the presenting symptoms, physical exam findings, and initial radiographic and laboratory data in the context of their chronic comorbidities.  Together, these circumstances are felt to place him at high risk for further clinical deterioration threatening life, limb, or organ.   Patient requires inpatient status due to high intensity of service, high risk for further deterioration and high frequency of surveillance required because of this acute illness that poses a threat to life, limb or bodily function.  Factors that support inpatient status as opposed to OBS status are bilateral pneumonia, having now worsened on oral therapy, and it is my clinical judgment that the patient will require inpatient hospital care spanning beyond 2 midnights and that early discharge would result in unnecessary risk of  decompensation and readmission or threat to life, limb or bodily function.

## 2016-09-28 LAB — CBC
HEMATOCRIT: 41.4 % (ref 39.0–52.0)
Hemoglobin: 14.3 g/dL (ref 13.0–17.0)
MCH: 29.9 pg (ref 26.0–34.0)
MCHC: 34.5 g/dL (ref 30.0–36.0)
MCV: 86.4 fL (ref 78.0–100.0)
Platelets: 214 10*3/uL (ref 150–400)
RBC: 4.79 MIL/uL (ref 4.22–5.81)
RDW: 13.6 % (ref 11.5–15.5)
WBC: 4.6 10*3/uL (ref 4.0–10.5)

## 2016-09-28 LAB — BASIC METABOLIC PANEL
Anion gap: 6 (ref 5–15)
BUN: 6 mg/dL (ref 6–20)
CHLORIDE: 109 mmol/L (ref 101–111)
CO2: 25 mmol/L (ref 22–32)
CREATININE: 0.86 mg/dL (ref 0.61–1.24)
Calcium: 8.7 mg/dL — ABNORMAL LOW (ref 8.9–10.3)
GFR calc Af Amer: >60 mL/min (ref >60)
GFR calc non Af Amer: >60 mL/min (ref >60)
GLUCOSE: 102 mg/dL — AB (ref 65–99)
Potassium: 3.6 mmol/L (ref 3.5–5.1)
Sodium: 140 mmol/L (ref 135–145)

## 2016-09-28 LAB — C DIFFICILE QUICK SCREEN W PCR REFLEX
C Diff antigen: NEGATIVE
C Diff interpretation: NOT DETECTED
C Diff toxin: NEGATIVE

## 2016-09-28 NOTE — Progress Notes (Signed)
Patient ID: Joshua Phillips, male   DOB: October 22, 1960, 56 y.o.   MRN: [id number]    PROGRESS NOTE    Joshua Phillips  FAO:[id number] DOB: 07-25-1960 DOA: 09/27/2016  PCP: Ricki Rodriguez, MD   Brief Narrative:  56 y.o. male with a past medical history significant for CAD s/p PCI who presented with pneumonia failed outpatient therapy.  The patient was in his usual state of health until the beginning of this month, he flew to [place] for a one week work trip (he is a Education officer, community) to [place].  The first day there he developed typical URI symptoms of coryza, sore throat, myalgias, fever.  He sought care after a few days and was prescribed ibuprofen, decongestants and cetirizine which did not help.   Assessment & Plan:  Pneumonia, bilateral lower lobes - d/w PCCM and recommendation was to continue supportive care with BD's - also continue Zithromax and Rocephin, day #2 - provide antitussives as needed - IS and flutter - repeat CXR in AM for follow up  Leukopenia, present on 3/12 - resolved  Thrombocytopenia, present on 3/12 - resolved   Coronary artery disease secondary prevention:  - Continue aspirin, statin, Brilinta, ACEi  DVT prophylaxis: Lovenox SQ Code Status: Full  Family Communication: Patient at bedside  Disposition Plan: Home once off IV ABX, suspect in 2-3 days   Consultants:   PCCM over the phone   Procedures:   None  Antimicrobials:   Zithromax 3/16 -->  Rocephin 3/16 -->  Subjective: Still with significant dyspnea and mostly with exertion.  Objective: Vitals:   09/27/16 1526 09/27/16 2036 09/28/16 0512 09/28/16 1011  BP:  115/65 125/80 115/75  Pulse:  67 63   Resp:  20 20   Temp:  98.6 F (37 C) 98.6 F (37 C)   TempSrc:      SpO2: 97% 97% 97%     Intake/Output Summary (Last 24 hours) at 09/28/16 1318 Last data filed at 09/28/16 0900  Gross per 24 hour  Intake          1212.92 ml  Output             1075 ml  Net           137.92 ml   There  were no vitals filed for this visit.  Examination:  General exam: Appears calm and comfortable  Respiratory system: Bilateral rhonchi mostly at bases  Cardiovascular system: S1 & S2 heard, RRR. No JVD, murmurs, rubs, gallops or clicks. No pedal edema. Gastrointestinal system: Abdomen is nondistended, soft and nontender. No organomegaly or masses felt.   Data Reviewed: I have personally reviewed following labs and imaging studies  CBC:  Recent Labs Lab 09/23/16 1224 09/26/16 2113 09/28/16 0728  WBC 3.2* 4.8 4.6  NEUTROABS 2.1 2.1  --   HGB 14.6 15.5 14.3  HCT 41.8 45.2 41.4  MCV 86.4 85.8 86.4  PLT 132* 212 214   Basic Metabolic Panel:  Recent Labs Lab 09/23/16 1224 09/26/16 2113 09/28/16 0728  NA 133* 138 140  K 3.5 3.7 3.6  CL 102 105 109  CO2 22 24 25   GLUCOSE 140* 105* 102*  BUN 12 9 6   CREATININE 1.21 0.88 0.86  CALCIUM 8.3* 9.0 8.7*   Liver Function Tests:  Recent Labs Lab 09/23/16 1224 09/26/16 2113  AST 99* 65*  ALT 68* 54  ALKPHOS 49 55  BILITOT 0.7 1.0  PROT 6.7 7.2  ALBUMIN 3.7 3.6    Recent Labs  Lab 09/26/16 2113  LIPASE 18   Urine analysis:    Component Value Date/Time   COLORURINE AMBER (A) 09/23/2016 1235   APPEARANCEUR HAZY (A) 09/23/2016 1235   LABSPEC 1.024 09/23/2016 1235   PHURINE 5.0 09/23/2016 1235   GLUCOSEU NEGATIVE 09/23/2016 1235   HGBUR NEGATIVE 09/23/2016 1235   BILIRUBINUR NEGATIVE 09/23/2016 1235   KETONESUR 20 (A) 09/23/2016 1235   PROTEINUR 30 (A) 09/23/2016 1235   UROBILINOGEN 1.0 02/02/2010 2132   NITRITE NEGATIVE 09/23/2016 1235   LEUKOCYTESUR NEGATIVE 09/23/2016 1235   Recent Results (from the past 240 hour(s))  Respiratory Panel by PCR     Status: None   Collection Time: 09/27/16  6:35 AM  Result Value Ref Range Status   Adenovirus NOT DETECTED NOT DETECTED Final   Coronavirus 229E NOT DETECTED NOT DETECTED Final   Coronavirus HKU1 NOT DETECTED NOT DETECTED Final   Coronavirus NL63 NOT DETECTED NOT  DETECTED Final   Coronavirus OC43 NOT DETECTED NOT DETECTED Final   Metapneumovirus NOT DETECTED NOT DETECTED Final   Rhinovirus / Enterovirus NOT DETECTED NOT DETECTED Final   Influenza A NOT DETECTED NOT DETECTED Final   Influenza B NOT DETECTED NOT DETECTED Final   Parainfluenza Virus 1 NOT DETECTED NOT DETECTED Final   Parainfluenza Virus 2 NOT DETECTED NOT DETECTED Final   Parainfluenza Virus 3 NOT DETECTED NOT DETECTED Final   Parainfluenza Virus 4 NOT DETECTED NOT DETECTED Final   Respiratory Syncytial Virus NOT DETECTED NOT DETECTED Final   Bordetella pertussis NOT DETECTED NOT DETECTED Final   Chlamydophila pneumoniae NOT DETECTED NOT DETECTED Final   Mycoplasma pneumoniae NOT DETECTED NOT DETECTED Final    Radiology Studies: Dg Chest 2 View  Result Date: 09/26/2016 CLINICAL DATA:  Back persistent cough and chest heaviness. Unable to tolerate antibiotics due to nausea. EXAM: CHEST  2 VIEW COMPARISON:  Chest radiograph September 23, 2016 FINDINGS: Cardiomediastinal silhouette is unremarkable and unchanged. Diffuse interstitial prominence with increasing bibasilar and RIGHT mid lung zone airspace opacities. No pleural effusion. No pneumothorax. Soft tissue planes and included osseous structures are nonsuspicious. IMPRESSION: Worsening bronchopneumonia. Followup PA and lateral chest X-ray is recommended in 3-4 weeks following trial of antibiotic therapy to ensure resolution and exclude underlying malignancy. Electronically Signed   By: Awilda Metro M.D.   On: 09/26/2016 22:14   Ct Angio Chest Pe W Or Wo Contrast  Result Date: 09/27/2016 CLINICAL DATA:  Shortness of breath and chest tightness EXAM: CT ANGIOGRAPHY CHEST WITH CONTRAST TECHNIQUE: Multidetector CT imaging of the chest was performed using the standard protocol during bolus administration of intravenous contrast. Multiplanar CT image reconstructions and MIPs were obtained to evaluate the vascular anatomy. CONTRAST:  100 mL  Isovue 370 nonionic COMPARISON:  Chest radiograph September 26, 2016 FINDINGS: Cardiovascular: There is no demonstrable pulmonary embolus. There is no thoracic aortic aneurysm or dissection. The visualize great vessels appear unremarkable. There are scattered foci of coronary artery calcification. Pericardium is not appreciably thickened. Mediastinum/Nodes: Thyroid appears normal. There is a focal lymph node in the superior left hilum measuring 1.1 x 1.1 cm. There is a sub- carinal lymph node measuring 1.4 x 1.2 cm. There are scattered subcentimeter mediastinal lymph nodes elsewhere. Lungs/Pleura: On axial slice 24 series 6, there is a 6 x 6 mm nodular opacity abutting the pleura in the apical segment of the left upper lobe. There is an opacity in the periphery of the apical segment of the right upper lobe seen on axial slice 25 series  6 measuring 9 x 9 mm. There is ill-defined patchy opacity in each upper lobe anterior segment, more on the right than on the left. Patchy airspace consolidation is noted throughout both lower lobes and inferior lingular regions, likely indicating multifocal pneumonia. A slightly lesser degree of patchy infiltrate is noted in the right middle lobe as well. There is no pleural effusion or pleural thickening. Upper Abdomen: Visualized upper abdominal structures appear unremarkable. Musculoskeletal: There is degenerative change in the thoracic spine. No blastic or lytic bone lesions are evident. Review of the MIP images confirms the above findings. IMPRESSION: No demonstrable pulmonary embolus. Widespread infiltrate throughout the lungs bilaterally, most severe in the lower lobes but present throughout the lungs bilaterally with are present. Differential considerations include widespread infectious pneumonia, allergic type phenomenon, inhalational type injury, or less likely collagen vascular disease. There are nodular opacities in the apices measuring as large as 9 mm. Non-contrast chest CT  at 3-6 months is recommended. If the nodules are stable at time of repeat CT, then future CT at 18-24 months (from today's scan) is considered optional for low-risk patients, but is recommended for high-risk patients. This recommendation follows the consensus statement: Guidelines for Management of Incidental Pulmonary Nodules Detected on CT Images: From the Fleischner Society 2017; Radiology 2017; 284:228-243. Mildly prominent left hilar and sub- carinal lymph nodes which may well be of reactive etiology given the extent of abnormality in the lungs. Areas of coronary artery calcification. Electronically Signed   By: Bretta BangWilliam  Woodruff III M.D.   On: 09/27/2016 12:43      Scheduled Meds: . aspirin EC  81 mg Oral Daily  . azithromycin  500 mg Intravenous Q24H  . cefTRIAXone (ROCEPHIN)  IV  1 g Intravenous Q24H  . enoxaparin (LOVENOX) injection  40 mg Subcutaneous Daily  . lisinopril  5 mg Oral Daily  . nebivolol  2.5 mg Oral Daily  . rosuvastatin  5 mg Oral Daily  . ticagrelor  60 mg Oral BID   Continuous Infusions: . sodium chloride 75 mL/hr at 09/28/16 0600     LOS: 1 day   Time spent: 20 minutes   Debbora PrestoMAGICK-Addison Freimuth, MD [facility] Pager [phone]  If 7PM-7AM, please contact night-coverage [email or web address] Password [email or web address] 09/28/2016, 1:18 PM

## 2016-09-29 ENCOUNTER — Inpatient Hospital Stay (HOSPITAL_COMMUNITY): Payer: BLUE CROSS/BLUE SHIELD

## 2016-09-29 LAB — GASTROINTESTINAL PANEL BY PCR, STOOL (REPLACES STOOL CULTURE)
ADENOVIRUS F40/41: NOT DETECTED
ASTROVIRUS: NOT DETECTED
Campylobacter species: NOT DETECTED
Cryptosporidium: NOT DETECTED
Cyclospora cayetanensis: NOT DETECTED
ENTAMOEBA HISTOLYTICA: NOT DETECTED
ENTEROAGGREGATIVE E COLI (EAEC): NOT DETECTED
ENTEROPATHOGENIC E COLI (EPEC): NOT DETECTED
ENTEROTOXIGENIC E COLI (ETEC): NOT DETECTED
Giardia lamblia: NOT DETECTED
NOROVIRUS GI/GII: NOT DETECTED
Plesimonas shigelloides: NOT DETECTED
Rotavirus A: NOT DETECTED
SAPOVIRUS (I, II, IV, AND V): NOT DETECTED
SHIGA LIKE TOXIN PRODUCING E COLI (STEC): NOT DETECTED
Salmonella species: NOT DETECTED
Shigella/Enteroinvasive E coli (EIEC): NOT DETECTED
VIBRIO CHOLERAE: NOT DETECTED
Vibrio species: NOT DETECTED
Yersinia enterocolitica: NOT DETECTED

## 2016-09-29 LAB — BASIC METABOLIC PANEL
Anion gap: 7 (ref 5–15)
BUN: 6 mg/dL (ref 6–20)
CO2: 23 mmol/L (ref 22–32)
CREATININE: 0.84 mg/dL (ref 0.61–1.24)
Calcium: 8.9 mg/dL (ref 8.9–10.3)
Chloride: 110 mmol/L (ref 101–111)
Glucose, Bld: 101 mg/dL — ABNORMAL HIGH (ref 65–99)
Potassium: 3.8 mmol/L (ref 3.5–5.1)
SODIUM: 140 mmol/L (ref 135–145)

## 2016-09-29 LAB — PROCALCITONIN

## 2016-09-29 LAB — CBC
HCT: 40.6 % (ref 39.0–52.0)
HEMOGLOBIN: 14.1 g/dL (ref 13.0–17.0)
MCH: 29.6 pg (ref 26.0–34.0)
MCHC: 34.7 g/dL (ref 30.0–36.0)
MCV: 85.1 fL (ref 78.0–100.0)
PLATELETS: 234 10*3/uL (ref 150–400)
RBC: 4.77 MIL/uL (ref 4.22–5.81)
RDW: 13.5 % (ref 11.5–15.5)
WBC: 5.8 10*3/uL (ref 4.0–10.5)

## 2016-09-29 MED ORDER — AZITHROMYCIN 250 MG PO TABS: 250.0000 mg | ORAL_TABLET | Freq: Every day | ORAL | 0 refills | Status: DC

## 2016-09-29 MED ORDER — GUAIFENESIN-DM 100-10 MG/5ML PO SYRP: 5.0000 mL | ORAL_SOLUTION | ORAL | 0 refills | Status: DC | PRN

## 2016-09-29 NOTE — Care Management (Signed)
Medical Necessity form entered in error - pt will discharge home via car driven by family

## 2016-09-29 NOTE — Discharge Instructions (Signed)
Community-Acquired Pneumonia, Adult °Pneumonia is an infection of the lungs. One type of pneumonia can happen while a person is in a hospital. A different type can happen when a person is not in a hospital (community-acquired pneumonia). It is easy for this kind to spread from person to person. It can spread to you if you breathe near an infected person who coughs or sneezes. Some symptoms include: °· A dry cough. °· A wet (productive) cough. °· Fever. °· Sweating. °· Chest pain. °Follow these instructions at home: °· Take over-the-counter and prescription medicines only as told by your doctor. °¨ Only take cough medicine if you are losing sleep. °¨ If you were prescribed an antibiotic medicine, take it as told by your doctor. Do not stop taking the antibiotic even if you start to feel better. °· Sleep with your head and neck raised (elevated). You can do this by putting a few pillows under your head, or you can sleep in a recliner. °· Do not use tobacco products. These include cigarettes, chewing tobacco, and e-cigarettes. If you need help quitting, ask your doctor. °· Drink enough water to keep your pee (urine) clear or pale yellow. °A shot (vaccine) can help prevent pneumonia. Shots are often suggested for: °· People older than 54 years of age. °· People older than 54 years of age: °¨ Who are having cancer treatment. °¨ Who have long-term (chronic) lung disease. °¨ Who have problems with their body's defense system (immune system). °You may also prevent pneumonia if you take these actions: °· Get the flu (influenza) shot every year. °· Go to the dentist as often as told. °· Wash your hands often. If soap and water are not available, use hand sanitizer. °Contact a doctor if: °· You have a fever. °· You lose sleep because your cough medicine does not help. °Get help right away if: °· You are short of breath and it gets worse. °· You have more chest pain. °· Your sickness gets worse. This is very serious if: °¨ You  are an older adult. °¨ Your body's defense system is weak. °· You cough up blood. °This information is not intended to replace advice given to you by your health care provider. Make sure you discuss any questions you have with your health care provider. °Document Released: 12/18/2007 Document Revised: 12/07/2015 Document Reviewed: 10/26/2014 °Elsevier Interactive Patient Education © 2017 [employer] ° °

## 2016-09-29 NOTE — Progress Notes (Signed)
Patient IV removed, discharged and given prescriptions. Patient walked down to main lobby with RN where daughter picked him up.

## 2016-09-29 NOTE — Care Management Note (Addendum)
Case Management Note  Patient Details  Name: Joshua Phillips MRN: [id number] Date of Birth: Apr 06, 1961  Subjective/Objective:   Pt admitted with PNA                 Action/Plan:   PTA from home independent with family.  Pt states he will call [employer] to get assistance with locating a PCP within network, pt denied barriers to discharging home /obtaining prescribed medications.  Pt confirmed he was already on Brilinta PTA.   Expected Discharge Date:  09/29/16               Expected Discharge Plan:  Home/Self Care  In-House Referral:     Discharge planning Services  CM Consult  Post Acute Care Choice:    Choice offered to:     DME Arranged:    DME Agency:     HH Arranged:    HH Agency:     Status of Service:  Completed, signed off  If discussed at [employer] Length [employer] Stay Meetings, dates discussed:    Additional Comments:  Joshua Phillips, Joshua Gillooly S, RN 09/29/2016, 1:44 PM

## 2016-09-29 NOTE — Discharge Summary (Signed)
Physician Discharge Summary  Joshua Phillips:[id number] DOB: 01/14/1961 DOA: 09/27/2016  PCP: Ricki Rodriguez, MD  Admit date: 09/27/2016 Discharge date: 09/29/2016  Recommendations for Outpatient Follow-up:  1. Pt will need to follow up with PCP in 2-3 weeks post discharge 2. Please obtain BMP to evaluate electrolytes and kidney function 3. Please also check CBC to evaluate Hg and Hct levels 4. Will ask Dr. De Hollingshead if he can see the pt is follow up  5. Pt to complete ABX therapy as outlined below   Discharge Diagnoses:  Principal Problem:   Pneumonia Active Problems:   Coronary artery disease due to lipid rich plaque    Discharge Condition: Stable  Diet recommendation: Heart healthy diet discussed in details   Brief Narrative:  56 y.o.malewith a past medical history significant for CAD s/p PCIwho presented with pneumonia failed outpatient therapy.  The patient was in his usual state of health until the beginning of this month, he flew to [place] for a one week work trip (he is a Education officer, community) to [place]. The first day there he developed typical URI symptoms of coryza, sore throat, myalgias, fever. He sought care after a few days and was prescribed ibuprofen, decongestants and cetirizine which did not help.   Assessment & Plan:  Pneumonia, bilateral lower lobes - d/w PCCM and recommendation was to continue supportive care with BD's - also continue Zithromax and Rocephin, day #3, pt insists on going home today, reports feeling better - oxygen saturation stable at rest and with exertion  - will ask Dr. De Hollingshead to see pt in an outpatient setting   Leukopenia, present on 3/12 - resolved  Thrombocytopenia, present on 3/12 - resolved   Coronary artery disease secondary prevention: - Continue aspirin, statin, Brilinta, ACEi  DVT prophylaxis: Lovenox SQ Code Status: Full  Family Communication: Patient at bedside, Dr. Sherril Croon over the phone   Disposition Plan: Home    Consultants:   PCCM over the phone   Procedures:   None  Antimicrobials:   Zithromax 3/16 --> continue on discharge to complete therapy   Rocephin 3/16 -->  Procedures/Studies: Dg Chest 2 View  Result Date: 09/26/2016 CLINICAL DATA:  Back persistent cough and chest heaviness. Unable to tolerate antibiotics due to nausea. EXAM: CHEST  2 VIEW COMPARISON:  Chest radiograph September 23, 2016 FINDINGS: Cardiomediastinal silhouette is unremarkable and unchanged. Diffuse interstitial prominence with increasing bibasilar and RIGHT mid lung zone airspace opacities. No pleural effusion. No pneumothorax. Soft tissue planes and included osseous structures are nonsuspicious. IMPRESSION: Worsening bronchopneumonia. Followup PA and lateral chest X-ray is recommended in 3-4 weeks following trial of antibiotic therapy to ensure resolution and exclude underlying malignancy. Electronically Signed   By: Awilda Metro M.D.   On: 09/26/2016 22:14   Dg Chest 2 View  Result Date: 09/23/2016 CLINICAL DATA:  Cough and body aches for the past week EXAM: CHEST  2 VIEW COMPARISON:  Chest x-ray dated November 03, 2010 FINDINGS: The lungs are well-expanded. The interstitial markings are coarse and more conspicuous overall. There is patchy airspace opacity peripherally in the left lower lung and in the right infrahilar region. There is no pleural effusion or pneumothorax. The heart and pulmonary vascularity are normal. There is calcification in the wall of the aortic arch. The bony thorax exhibits no acute abnormality. IMPRESSION: Patchy airspace opacities bilaterally worrisome for atelectasis or early pneumonia. No pleural effusion or CHF. Followup PA and lateral chest X-ray is recommended in 3-4 weeks following trial of antibiotic  therapy to ensure resolution and exclude underlying malignancy. Thoracic aortic atherosclerosis. Electronically Signed   By: David  [place] M.D.   On: 09/23/2016 12:52   Ct Angio Chest Pe W  Or Wo Contrast  Result Date: 09/27/2016 CLINICAL DATA:  Shortness of breath and chest tightness EXAM: CT ANGIOGRAPHY CHEST WITH CONTRAST TECHNIQUE: Multidetector CT imaging of the chest was performed using the standard protocol during bolus administration of intravenous contrast. Multiplanar CT image reconstructions and MIPs were obtained to evaluate the vascular anatomy. CONTRAST:  100 mL Isovue 370 nonionic COMPARISON:  Chest radiograph September 26, 2016 FINDINGS: Cardiovascular: There is no demonstrable pulmonary embolus. There is no thoracic aortic aneurysm or dissection. The visualize great vessels appear unremarkable. There are scattered foci of coronary artery calcification. Pericardium is not appreciably thickened. Mediastinum/Nodes: Thyroid appears normal. There is a focal lymph node in the superior left hilum measuring 1.1 x 1.1 cm. There is a sub- carinal lymph node measuring 1.4 x 1.2 cm. There are scattered subcentimeter mediastinal lymph nodes elsewhere. Lungs/Pleura: On axial slice 24 series 6, there is a 6 x 6 mm nodular opacity abutting the pleura in the apical segment of the left upper lobe. There is an opacity in the periphery of the apical segment of the right upper lobe seen on axial slice 25 series 6 measuring 9 x 9 mm. There is ill-defined patchy opacity in each upper lobe anterior segment, more on the right than on the left. Patchy airspace consolidation is noted throughout both lower lobes and inferior lingular regions, likely indicating multifocal pneumonia. A slightly lesser degree of patchy infiltrate is noted in the right middle lobe as well. There is no pleural effusion or pleural thickening. Upper Abdomen: Visualized upper abdominal structures appear unremarkable. Musculoskeletal: There is degenerative change in the thoracic spine. No blastic or lytic bone lesions are evident. Review of the MIP images confirms the above findings. IMPRESSION: No demonstrable pulmonary embolus. Widespread  infiltrate throughout the lungs bilaterally, most severe in the lower lobes but present throughout the lungs bilaterally with are present. Differential considerations include widespread infectious pneumonia, allergic type phenomenon, inhalational type injury, or less likely collagen vascular disease. There are nodular opacities in the apices measuring as large as 9 mm. Non-contrast chest CT at 3-6 months is recommended. If the nodules are stable at time of repeat CT, then future CT at 18-24 months (from today's scan) is considered optional for low-risk patients, but is recommended for high-risk patients. This recommendation follows the consensus statement: Guidelines for Management of Incidental Pulmonary Nodules Detected on CT Images: From the Fleischner Society 2017; Radiology 2017; 284:228-243. Mildly prominent left hilar and sub- carinal lymph nodes which may well be of reactive etiology given the extent of abnormality in the lungs. Areas of coronary artery calcification. Electronically Signed   By: Bretta Bang III M.D.   On: 09/27/2016 12:43     Discharge Exam: Vitals:   09/28/16 2132 09/29/16 0513  BP: 109/70 123/77  Pulse: (!) 59 66  Resp: 19 18  Temp: 98.4 F (36.9 C) 99.3 F (37.4 C)   Vitals:   09/28/16 0512 09/28/16 1011 09/28/16 2132 09/29/16 0513  BP: 125/80 115/75 109/70 123/77  Pulse: 63  (!) 59 66  Resp: 20  19 18   Temp: 98.6 F (37 C)  98.4 F (36.9 C) 99.3 F (37.4 C)  TempSrc:   Oral Oral  SpO2: 97%  95% 98%    General: Pt is alert, follows commands appropriately, not in acute  distress Cardiovascular: Regular rate and rhythm, S1/S2 +, no murmurs, no rubs, no gallops Respiratory: mild rhonchi at bases, otherwise rather clear, deep breathing provokes coughing spells  Abdominal: Soft, non tender, non distended, bowel sounds +, no guarding Extremities: no edema, no cyanosis, pulses palpable bilaterally DP and PT Neuro: Grossly nonfocal  Discharge  Instructions   Allergies as of 09/29/2016   No Known Allergies     Medication List    TAKE these medications   aspirin EC 81 MG tablet Take 81 mg by mouth daily.   azithromycin 250 MG tablet Commonly known as:  ZITHROMAX Take 1 tablet (250 mg total) by mouth daily.   BELVIQ 10 MG Tabs Generic drug:  Lorcaserin HCl Take 10 mg by mouth 2 (two) times daily.   BRILINTA 60 MG Tabs tablet Generic drug:  ticagrelor Take 60 mg by mouth 2 (two) times daily.   CO Q-10 PO Take 1 capsule by mouth daily.   CRESTOR 10 MG tablet Generic drug:  rosuvastatin Take 5 mg by mouth daily.   FISH OIL PO Take 1 capsule by mouth daily.   guaiFENesin-dextromethorphan 100-10 MG/5ML syrup Commonly known as:  ROBITUSSIN DM Take 5 mLs by mouth every 4 (four) hours as needed for cough.   HYDROcodone-homatropine 5-1.5 MG/5ML syrup Commonly known as:  HYCODAN Take 5 mLs by mouth every 6 (six) hours as needed for cough.   ibuprofen 200 MG tablet Commonly known as:  ADVIL,MOTRIN Take 200-400 mg by mouth every 6 (six) hours as needed (for pain, fever, or headaches).   lisinopril 5 MG tablet Commonly known as:  PRINIVIL,ZESTRIL Take 5 mg by mouth daily.   metoprolol succinate 25 MG 24 hr tablet Commonly known as:  TOPROL-XL Take 25 mg by mouth daily.   VITAMIN D-3 PO Take 1 capsule by mouth daily.      Follow-up Information    Ricki RodriguezKADAKIA,AJAY S, MD Follow up.   Specialty:  Cardiology Contact information: [address] [place] [address] [phone]            The results of significant diagnostics from this hospitalization (including imaging, microbiology, ancillary and laboratory) are listed below for reference.     Microbiology: Recent Results (from the past 240 hour(s))  Respiratory Panel by PCR     Status: None   Collection Time: 09/27/16  6:35 AM  Result Value Ref Range Status   Adenovirus NOT DETECTED NOT DETECTED Final   Coronavirus 229E NOT DETECTED NOT  DETECTED Final   Coronavirus HKU1 NOT DETECTED NOT DETECTED Final   Coronavirus NL63 NOT DETECTED NOT DETECTED Final   Coronavirus OC43 NOT DETECTED NOT DETECTED Final   Metapneumovirus NOT DETECTED NOT DETECTED Final   Rhinovirus / Enterovirus NOT DETECTED NOT DETECTED Final   Influenza A NOT DETECTED NOT DETECTED Final   Influenza B NOT DETECTED NOT DETECTED Final   Parainfluenza Virus 1 NOT DETECTED NOT DETECTED Final   Parainfluenza Virus 2 NOT DETECTED NOT DETECTED Final   Parainfluenza Virus 3 NOT DETECTED NOT DETECTED Final   Parainfluenza Virus 4 NOT DETECTED NOT DETECTED Final   Respiratory Syncytial Virus NOT DETECTED NOT DETECTED Final   Bordetella pertussis NOT DETECTED NOT DETECTED Final   Chlamydophila pneumoniae NOT DETECTED NOT DETECTED Final   Mycoplasma pneumoniae NOT DETECTED NOT DETECTED Final  C difficile quick scan w PCR reflex     Status: None   Collection Time: 09/27/16  5:58 PM  Result Value Ref Range Status  C Diff antigen NEGATIVE NEGATIVE Final   C Diff toxin NEGATIVE NEGATIVE Final   C Diff interpretation No C. difficile detected.  Final     Labs: Basic Metabolic Panel:  Recent Labs Lab 09/23/16 1224 09/26/16 2113 09/28/16 0728 09/29/16 0514  NA 133* 138 140 140  K 3.5 3.7 3.6 3.8  CL 102 105 109 110  CO2 22 24 25 23   GLUCOSE 140* 105* 102* 101*  BUN 12 9 6 6   CREATININE 1.21 0.88 0.86 0.84  CALCIUM 8.3* 9.0 8.7* 8.9   Liver Function Tests:  Recent Labs Lab 09/23/16 1224 09/26/16 2113  AST 99* 65*  ALT 68* 54  ALKPHOS 49 55  BILITOT 0.7 1.0  PROT 6.7 7.2  ALBUMIN 3.7 3.6    Recent Labs Lab 09/26/16 2113  LIPASE 18   No results for input(s): AMMONIA in the last 168 hours. CBC:  Recent Labs Lab 09/23/16 1224 09/26/16 2113 09/28/16 0728 09/29/16 0514  WBC 3.2* 4.8 4.6 5.8  NEUTROABS 2.1 2.1  --   --   HGB 14.6 15.5 14.3 14.1  HCT 41.8 45.2 41.4 40.6  MCV 86.4 85.8 86.4 85.1  PLT 132* 212 214 234   Cardiac  Enzymes: No results for input(s): CKTOTAL, CKMB, CKMBINDEX, TROPONINI in the last 168 hours. BNP: BNP (last 3 results)  Recent Labs  09/27/16 0612  BNP 31.5    ProBNP (last 3 results) No results for input(s): PROBNP in the last 8760 hours.  CBG: No results for input(s): GLUCAP in the last 168 hours.   SIGNED: Time coordinating discharge: 30 minutes  Debbora Presto, MD  [facility] 09/29/2016, 12:39 PM Pager [phone]  If 7PM-7AM, please contact night-coverage [email or web address] Password [email or web address]

## 2016-10-09 ENCOUNTER — Telehealth: Payer: Self-pay

## 2016-10-09 DIAGNOSIS — E785 Hyperlipidemia, unspecified: Secondary | ICD-10-CM | POA: Diagnosis not present

## 2016-10-09 DIAGNOSIS — I10 Essential (primary) hypertension: Secondary | ICD-10-CM | POA: Diagnosis not present

## 2016-10-09 DIAGNOSIS — I2510 Atherosclerotic heart disease of native coronary artery without angina pectoris: Secondary | ICD-10-CM | POA: Diagnosis not present

## 2016-10-09 DIAGNOSIS — J189 Pneumonia, unspecified organism: Secondary | ICD-10-CM | POA: Diagnosis not present

## 2016-10-09 NOTE — Telephone Encounter (Signed)
Spoke with pt, and offered hosp f/u for 10/09/16 @ 1:45 per PM. Pt states he can not make this apt due to having another apt already scheduled. Pt states he will call back to schedule hospital f/u. Will await call back.

## 2016-11-29 ENCOUNTER — Other Ambulatory Visit: Payer: Self-pay | Admitting: Family Medicine

## 2016-11-29 ENCOUNTER — Ambulatory Visit
Admission: RE | Admit: 2016-11-29 | Discharge: 2016-11-29 | Disposition: A | Discharge status: Home / Self Care | Payer: BLUE CROSS/BLUE SHIELD | Source: Ambulatory Visit | Attending: Family Medicine | Admitting: Family Medicine

## 2016-11-29 DIAGNOSIS — J189 Pneumonia, unspecified organism: Secondary | ICD-10-CM

## 2016-12-31 DIAGNOSIS — I2510 Atherosclerotic heart disease of native coronary artery without angina pectoris: Secondary | ICD-10-CM | POA: Diagnosis not present

## 2016-12-31 DIAGNOSIS — E663 Overweight: Secondary | ICD-10-CM | POA: Diagnosis not present

## 2016-12-31 DIAGNOSIS — I10 Essential (primary) hypertension: Secondary | ICD-10-CM | POA: Diagnosis not present

## 2016-12-31 DIAGNOSIS — Z9861 Coronary angioplasty status: Secondary | ICD-10-CM | POA: Diagnosis not present

## 2017-05-07 DIAGNOSIS — Z9861 Coronary angioplasty status: Secondary | ICD-10-CM | POA: Diagnosis not present

## 2017-05-07 DIAGNOSIS — I10 Essential (primary) hypertension: Secondary | ICD-10-CM | POA: Diagnosis not present

## 2017-05-07 DIAGNOSIS — I2510 Atherosclerotic heart disease of native coronary artery without angina pectoris: Secondary | ICD-10-CM | POA: Diagnosis not present

## 2017-05-07 DIAGNOSIS — E669 Obesity, unspecified: Secondary | ICD-10-CM | POA: Diagnosis not present

## 2017-11-12 DIAGNOSIS — I2510 Atherosclerotic heart disease of native coronary artery without angina pectoris: Secondary | ICD-10-CM | POA: Diagnosis not present

## 2017-11-12 DIAGNOSIS — I10 Essential (primary) hypertension: Secondary | ICD-10-CM | POA: Diagnosis not present

## 2017-11-12 DIAGNOSIS — E669 Obesity, unspecified: Secondary | ICD-10-CM | POA: Diagnosis not present

## 2017-11-12 DIAGNOSIS — Z9861 Coronary angioplasty status: Secondary | ICD-10-CM | POA: Diagnosis not present

## 2017-11-14 DIAGNOSIS — E7800 Pure hypercholesterolemia, unspecified: Secondary | ICD-10-CM | POA: Diagnosis not present

## 2017-11-14 DIAGNOSIS — I2510 Atherosclerotic heart disease of native coronary artery without angina pectoris: Secondary | ICD-10-CM | POA: Diagnosis not present

## 2018-02-09 IMAGING — CR DG CHEST 2V
2 series · 2 of 2 positions shown · non-contrast
Comparison: Chest radiograph September 23, 2016

CLINICAL DATA: Back persistent cough and chest heaviness. Unable to
tolerate antibiotics due to nausea.

EXAM:
CHEST  2 VIEW

[chest pa]
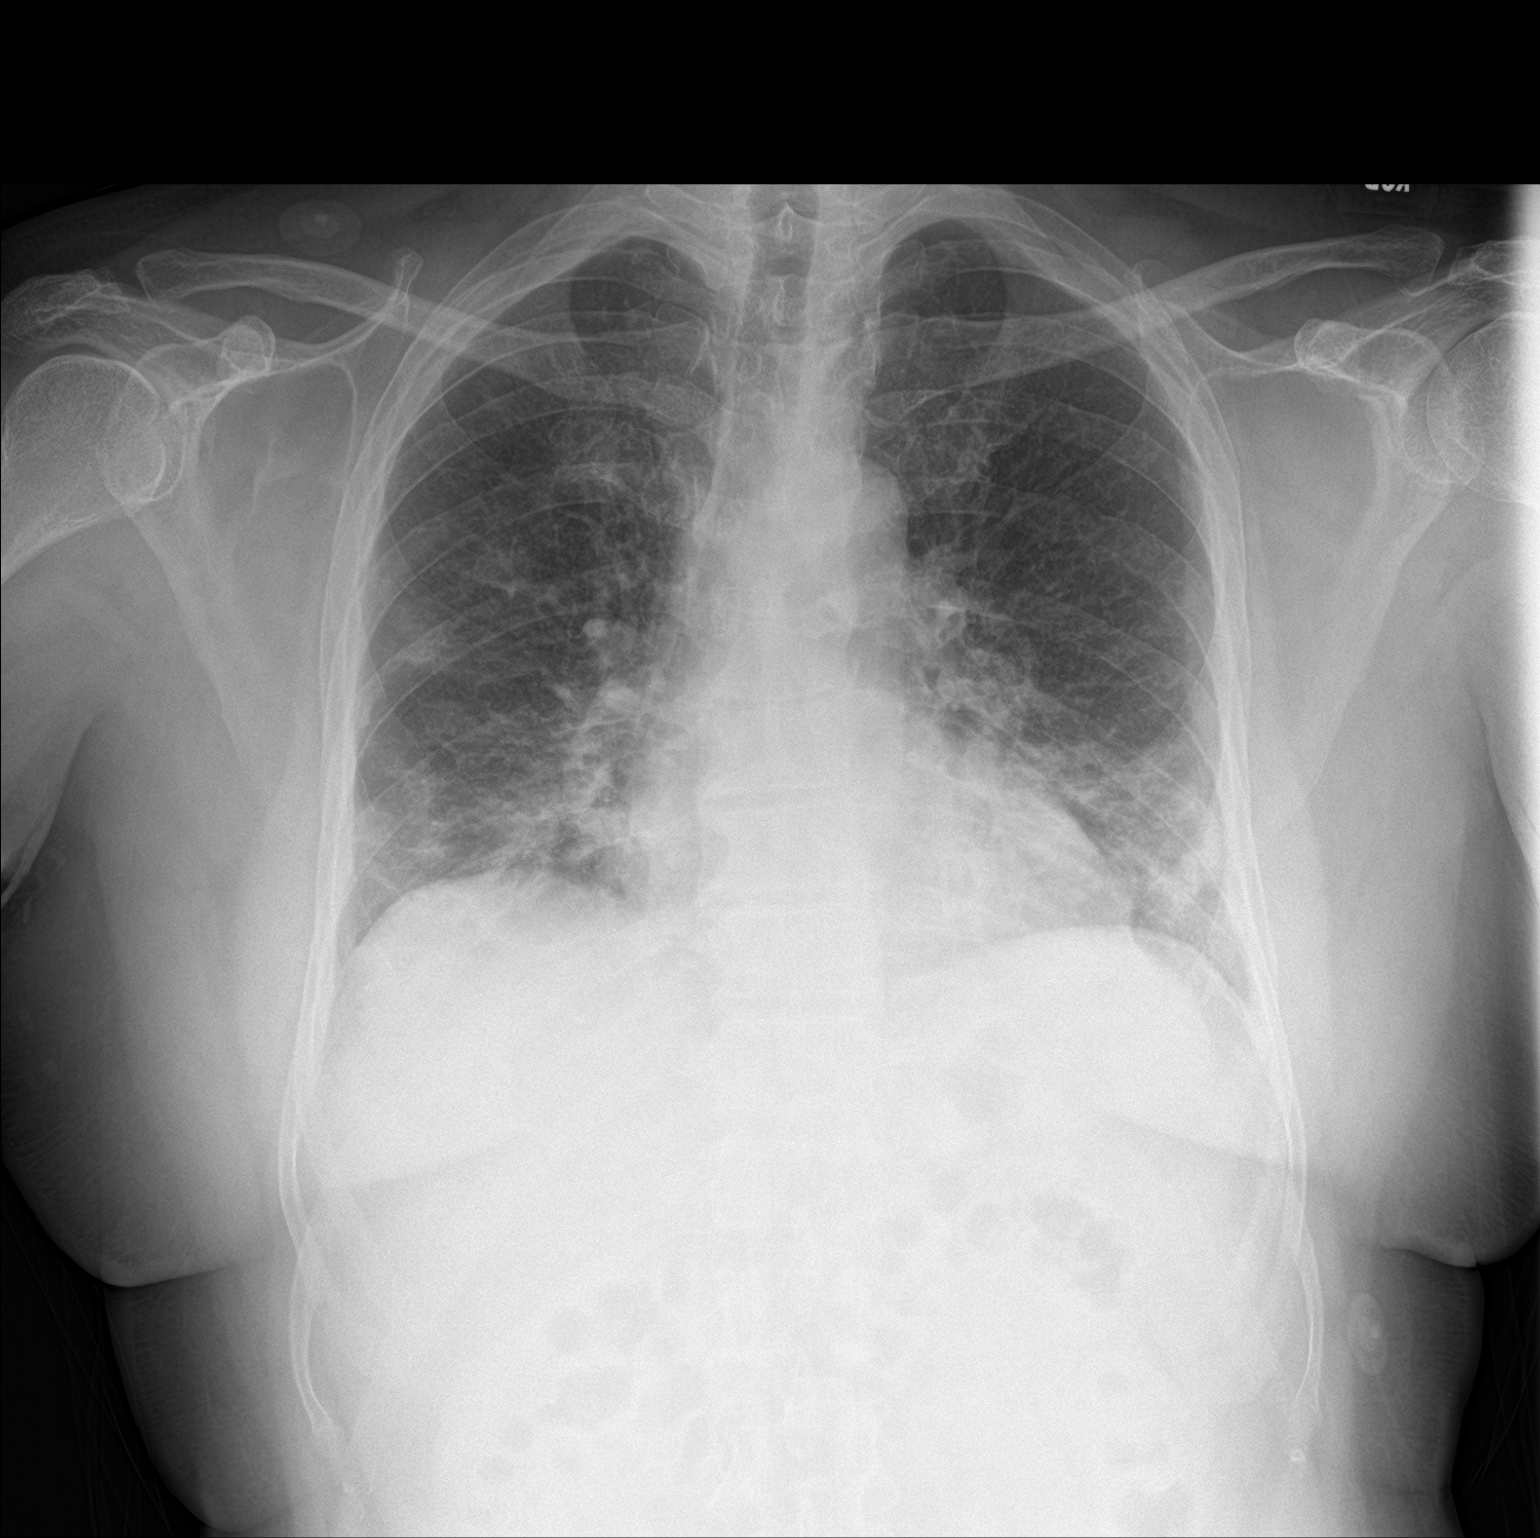

[chest lat]
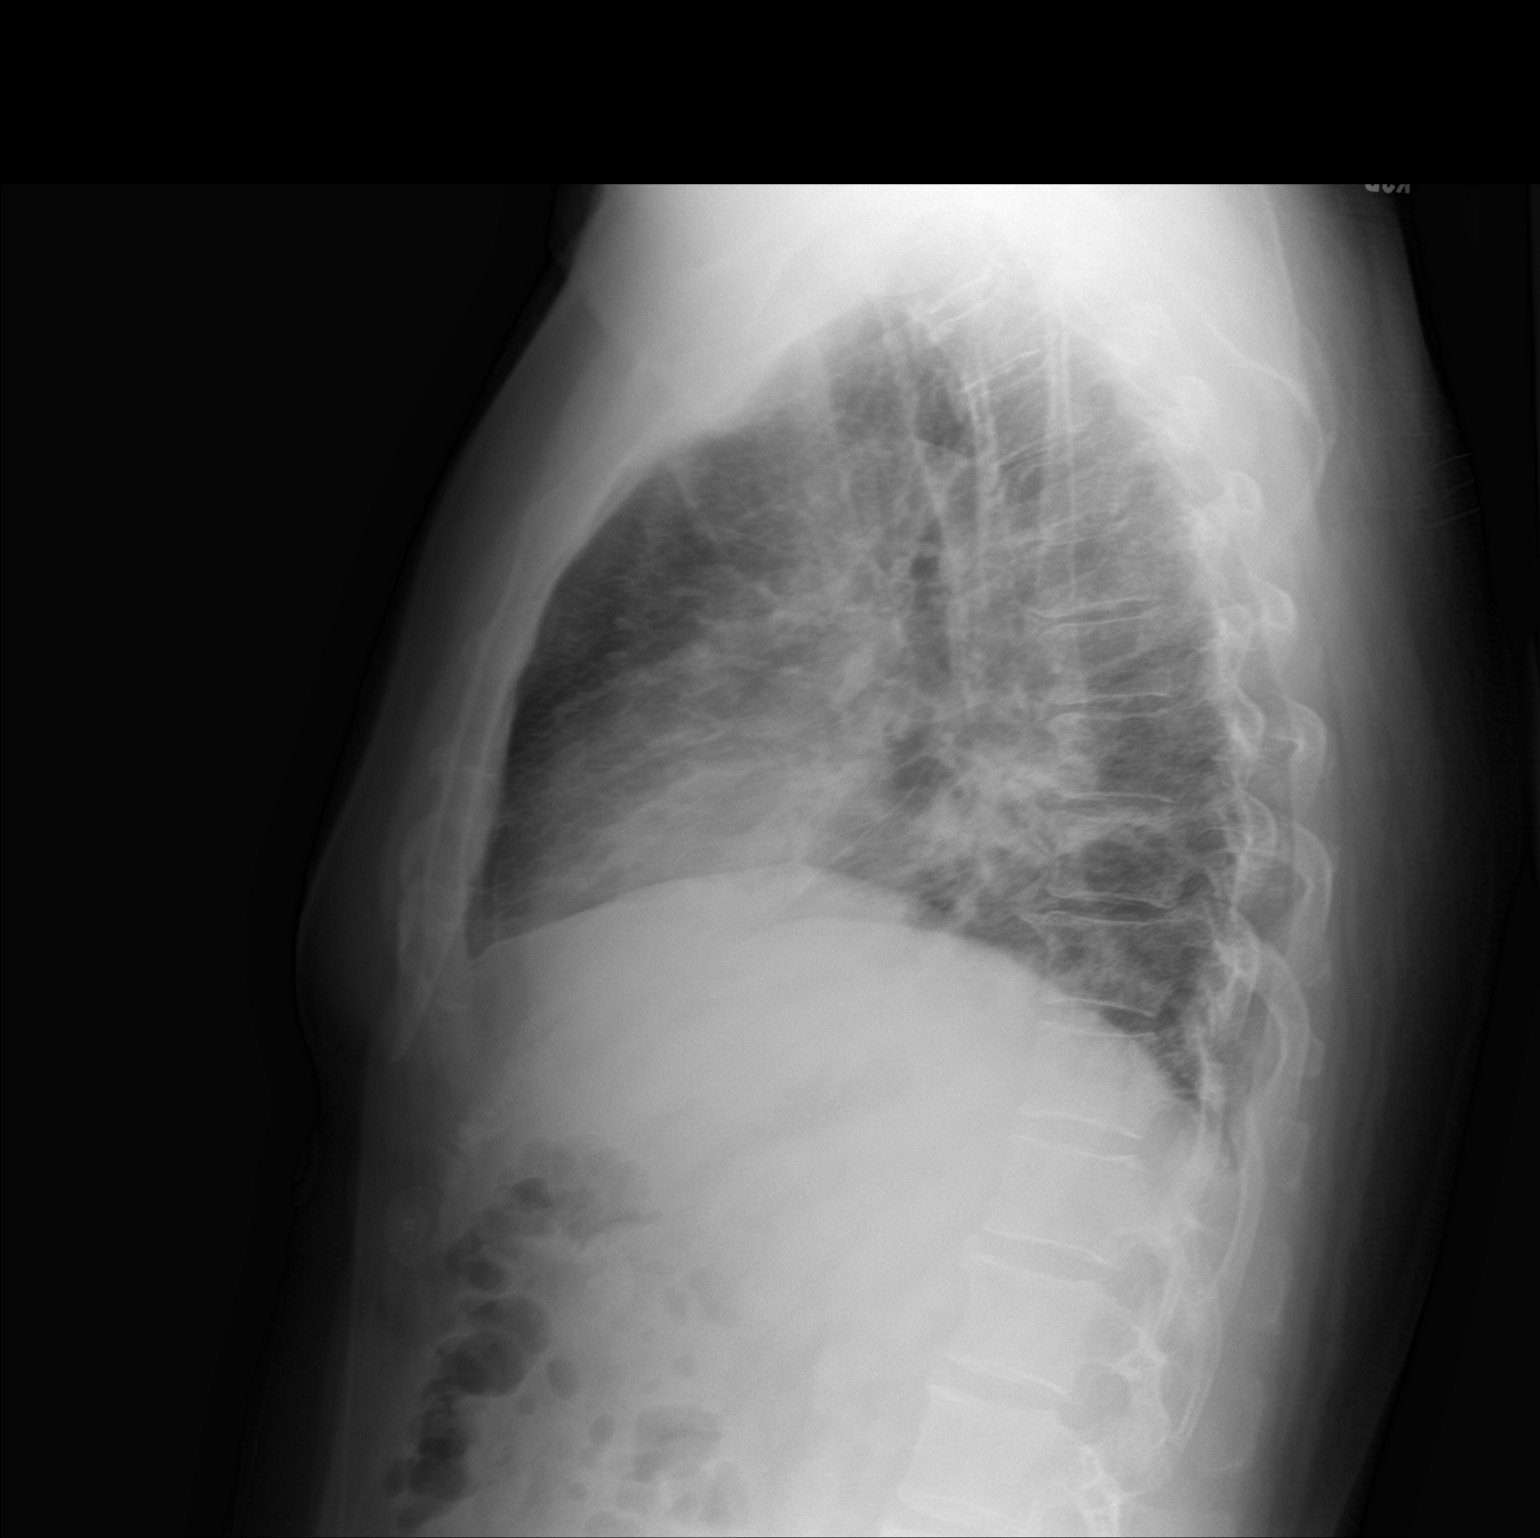

[2 of 2 positions shown; findings below may reference images not displayed]

FINDINGS: Cardiomediastinal silhouette is unremarkable and unchanged. Diffuse
interstitial prominence with increasing bibasilar and RIGHT mid lung
zone airspace opacities. No pleural effusion. No pneumothorax. Soft
tissue planes and included osseous structures are nonsuspicious.
IMPRESSION: Worsening bronchopneumonia. Followup PA and lateral chest X-ray is
recommended in 3-4 weeks following trial of antibiotic therapy to
ensure resolution and exclude underlying malignancy.

## 2018-02-12 IMAGING — DX DG CHEST 2V
2 series · 2 of 2 positions shown · non-contrast
Comparison: 09/26/2016

CLINICAL DATA: Productive cough which is improving.

EXAM:
CHEST  2 VIEW

[w chest pa]
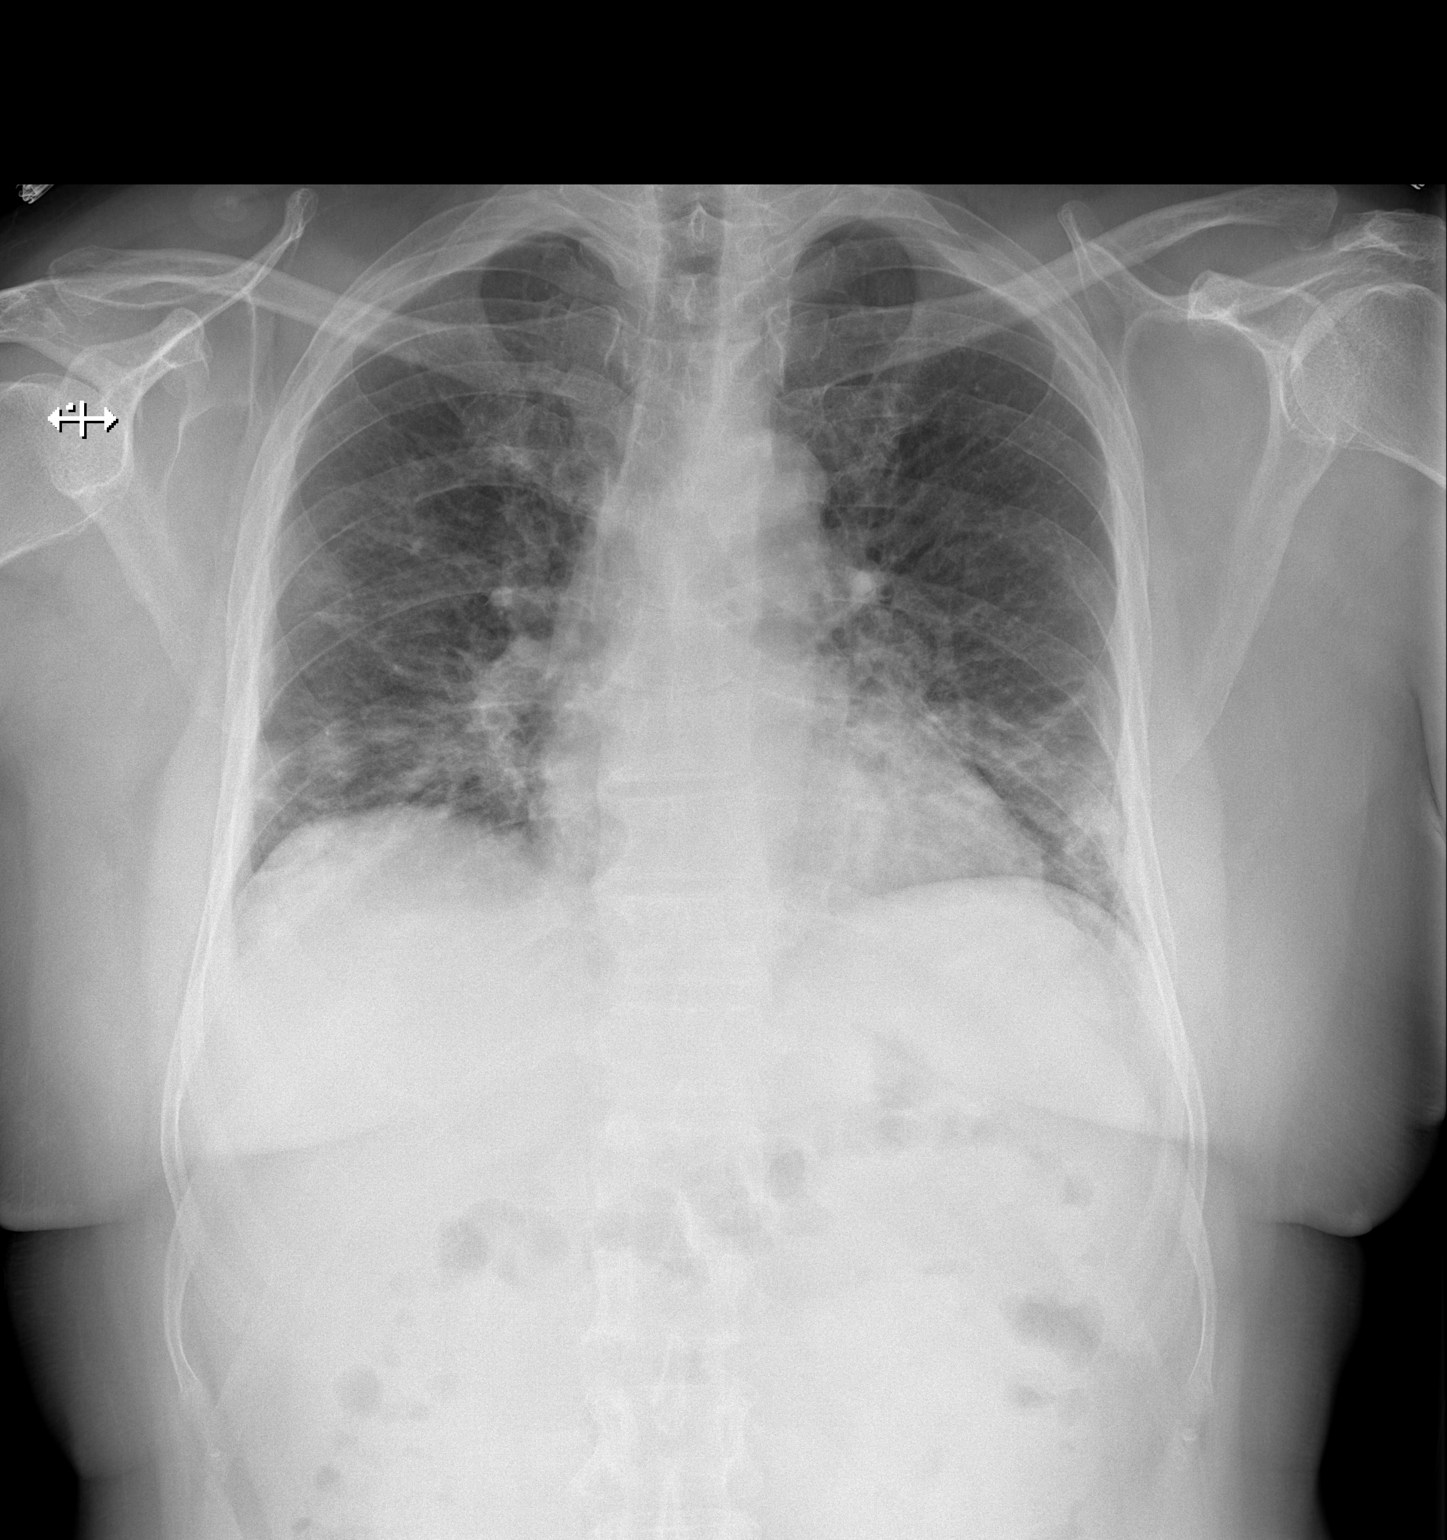

[w chest lat]
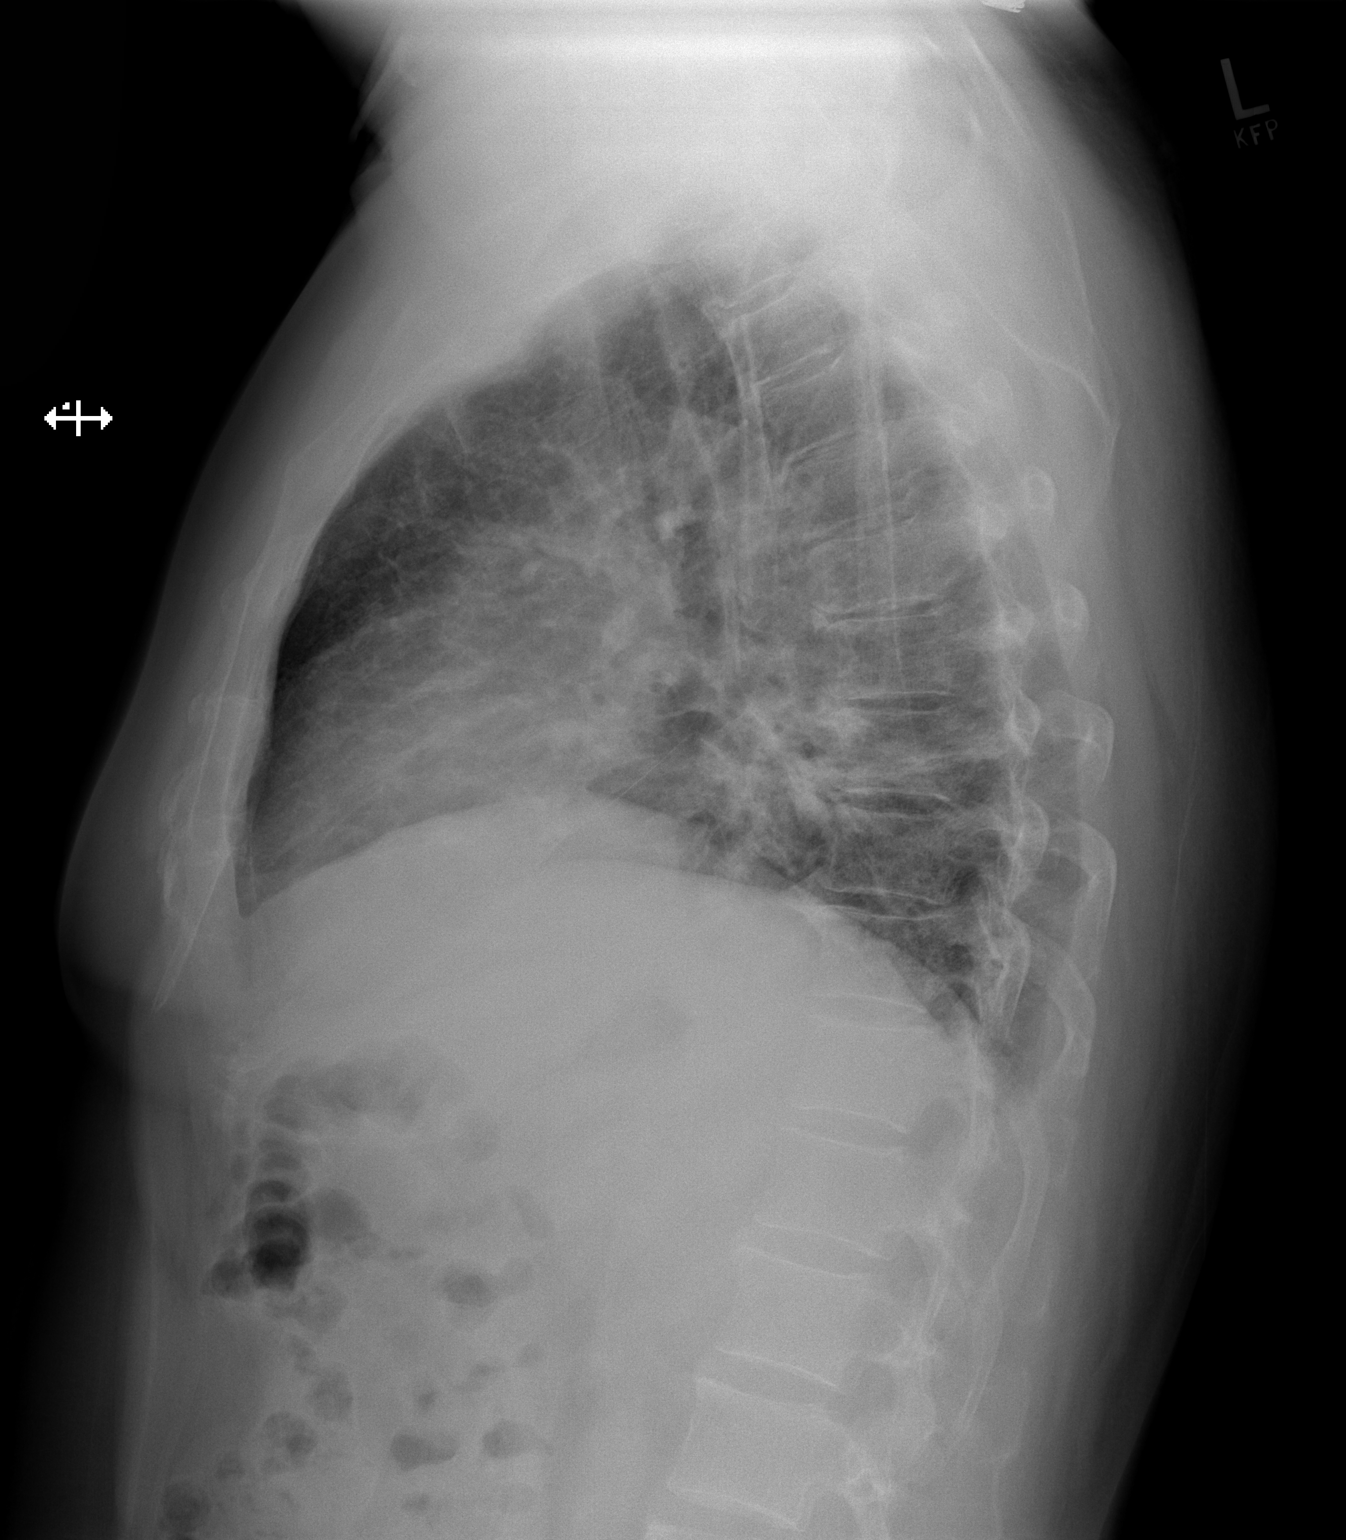

[2 of 2 positions shown; findings below may reference images not displayed]

FINDINGS: Heart size is normal. Mediastinal shadows are normal. Patchy mid and
lower lung infiltrates bilaterally appear the same as they did 3
days ago. No worsening or new finding. No effusions. Bony structures
are unremarkable.
IMPRESSION: Patchy bilateral mid and lower lung infiltrates, unchanged since the
radiography of 3 days ago.

## 2018-09-26 ENCOUNTER — Other Ambulatory Visit: Payer: Self-pay | Admitting: Cardiology

## 2018-11-23 ENCOUNTER — Ambulatory Visit: Payer: Self-pay | Admitting: Cardiology

## 2019-01-08 ENCOUNTER — Other Ambulatory Visit: Payer: Self-pay | Admitting: Cardiology

## 2019-01-08 NOTE — Telephone Encounter (Signed)
Please fill

## 2019-01-17 ENCOUNTER — Other Ambulatory Visit: Payer: Self-pay | Admitting: Cardiology

## 2019-04-03 ENCOUNTER — Other Ambulatory Visit: Payer: Self-pay | Admitting: Cardiology

## 2019-05-05 ENCOUNTER — Other Ambulatory Visit: Payer: Self-pay | Admitting: Cardiology

## 2019-06-29 ENCOUNTER — Other Ambulatory Visit: Payer: Self-pay | Admitting: Cardiology

## 2019-06-30 ENCOUNTER — Other Ambulatory Visit: Payer: Self-pay | Admitting: Cardiology

## 2019-06-30 DIAGNOSIS — I2510 Atherosclerotic heart disease of native coronary artery without angina pectoris: Secondary | ICD-10-CM

## 2019-06-30 LAB — CBC
Hematocrit: 43.6 % (ref 37.5–51.0)
Hemoglobin: 15 g/dL (ref 13.0–17.7)
MCH: 29.5 pg (ref 26.6–33.0)
MCHC: 34.4 g/dL (ref 31.5–35.7)
MCV: 86 fL (ref 79–97)
Platelets: 252 10*3/uL (ref 150–450)
RBC: 5.08 x10E6/uL (ref 4.14–5.80)
RDW: 12.8 % (ref 11.6–15.4)
WBC: 7.2 10*3/uL (ref 3.4–10.8)

## 2019-06-30 NOTE — Progress Notes (Signed)
Lab orders placed.  

## 2019-07-01 LAB — COMPREHENSIVE METABOLIC PANEL
ALT: 52 IU/L — ABNORMAL HIGH (ref 0–44)
AST: 44 IU/L — ABNORMAL HIGH (ref 0–40)
Albumin/Globulin Ratio: 1.4 (ref 1.2–2.2)
Albumin: 4.3 g/dL (ref 3.8–4.9)
Alkaline Phosphatase: 81 IU/L (ref 39–117)
BUN/Creatinine Ratio: 9 (ref 9–20)
BUN: 10 mg/dL (ref 6–24)
Bilirubin Total: 0.6 mg/dL (ref 0.0–1.2)
CO2: 22 mmol/L (ref 20–29)
Calcium: 10.9 mg/dL — ABNORMAL HIGH (ref 8.7–10.2)
Chloride: 103 mmol/L (ref 96–106)
Creatinine, Ser: 1.13 mg/dL (ref 0.76–1.27)
GFR calc Af Amer: 84 mL/min/{1.73_m2} (ref >59)
GFR calc non Af Amer: 72 mL/min/{1.73_m2} (ref >59)
Globulin, Total: 3.1 g/dL (ref 1.5–4.5)
Glucose: 176 mg/dL — ABNORMAL HIGH (ref 65–99)
Potassium: 4.4 mmol/L (ref 3.5–5.2)
Sodium: 139 mmol/L (ref 134–144)
Total Protein: 7.4 g/dL (ref 6.0–8.5)

## 2019-07-01 LAB — LIPID PANEL
Chol/HDL Ratio: 4.9 ratio (ref 0.0–5.0)
Cholesterol, Total: 132 mg/dL (ref 100–199)
HDL: 27 mg/dL — ABNORMAL LOW (ref >39)
LDL Chol Calc (NIH): 84 mg/dL (ref 0–99)
Triglycerides: 112 mg/dL (ref 0–149)
VLDL Cholesterol Cal: 21 mg/dL (ref 5–40)

## 2019-07-01 LAB — TSH: TSH: 1.64 u[IU]/mL (ref 0.450–4.500)

## 2019-07-01 LAB — HEMOGLOBIN A1C
Est. average glucose Bld gHb Est-mCnc: 192 mg/dL
Hgb A1c MFr Bld: 8.3 % — ABNORMAL HIGH (ref 4.8–5.6)

## 2019-07-01 NOTE — Progress Notes (Signed)
Primary Physician/Referring:  Donald Prose, MD  Patient ID: Joshua Phillips, male    DOB: 13-Jan-1961, 58 y.o.   MRN: [id number]  Chief Complaint  Patient presents with  . Coronary Artery Disease  . Hyperlipidemia   HPI:    HPI: Joshua Phillips  is a 58 y.o. with CAD S/P stenting to his distal right coronary artery in July 2011 for exertional angina pectoris. Patient last seen in May 2019. He has been feeling tired and fatigued and has been sleeping more but continues to remain active and exercises regularly without any chest pain or dyspnea.  He felt he may have got diabetes and wanted to be seen, underwent labs.  Otherwise remains asymptomatic.  Continues to have difficulty in weight loss.  Past medical history significant for hypertension, hyperlipidemia and hyperglycemia.  Past Medical History:  Diagnosis Date  . Hypertension    Past Surgical History:  Procedure Laterality Date  . CARDIAC CATHETERIZATION    . CARDIAC SURGERY     stent placement   Social History   Socioeconomic History  . Marital status: Married    Spouse name: Not on file  . Number of children: 2  . Years of education: Not on file  . Highest education level: Not on file  Occupational History  . Not on file  Tobacco Use  . Smoking status: Former Smoker    Packs/day: 0.25    Years: 10.00    Pack years: 2.50    Quit date: 1997    Years since quitting: 23.9  . Smokeless tobacco: Never Used  Substance and Sexual Activity  . Alcohol use: Yes    Comment: occ  . Drug use: No  . Sexual activity: Yes  Other Topics Concern  . Not on file  Social History Narrative  . Not on file   Social Determinants of Health   Financial Resource Strain:   . Difficulty of Paying Living Expenses: Not on file  Food Insecurity:   . Worried About Charity fundraiser in the Last Year: Not on file  . Ran Out of Food in the Last Year: Not on file  Transportation Needs:   . Lack of Transportation (Medical): Not on file  .  Lack of Transportation (Non-Medical): Not on file  Physical Activity:   . Days of Exercise per Week: Not on file  . Minutes of Exercise per Session: Not on file  Stress:   . Feeling of Stress : Not on file  Social Connections:   . Frequency of Communication with Friends and Family: Not on file  . Frequency of Social Gatherings with Friends and Family: Not on file  . Attends Religious Services: Not on file  . Active Member of Clubs or Organizations: Not on file  . Attends Archivist Meetings: Not on file  . Marital Status: Not on file  Intimate Partner Violence:   . Fear of Current or Ex-Partner: Not on file  . Emotionally Abused: Not on file  . Physically Abused: Not on file  . Sexually Abused: Not on file   ROS  Review of Systems  Constitution: Positive for weight gain. Negative for decreased appetite, malaise/fatigue and weight loss.  Eyes: Negative for visual disturbance.  Cardiovascular: Negative for chest pain, claudication, dyspnea on exertion, leg swelling, orthopnea, palpitations and syncope.  Respiratory: Positive for snoring. Negative for hemoptysis and wheezing.   Endocrine: Negative for cold intolerance and heat intolerance.  Hematologic/Lymphatic: Does not bruise/bleed easily.  Skin:  Negative for nail changes.  Musculoskeletal: Negative for muscle weakness and myalgias.  Gastrointestinal: Negative for abdominal pain, change in bowel habit, nausea and vomiting.  Neurological: Negative for difficulty with concentration, dizziness, focal weakness and headaches.  Psychiatric/Behavioral: Negative for altered mental status and suicidal ideas.  All other systems reviewed and are negative.  Objective  Blood pressure (!) 142/93, pulse 74, temperature 98.4 F (36.9 C), height 5\' 8"  (1.727 m), weight 229 lb (103.9 kg), SpO2 96 %. Body mass index is 34.82 kg/m.   Physical Exam  Constitutional: He is oriented to person, place, and time. Vital signs are normal.  He  is well-built and mild to moderately obese in no acute distress.  HENT:  Head: Normocephalic and atraumatic.  Cardiovascular: Normal rate, regular rhythm, normal heart sounds and intact distal pulses.  No JVD, no leg edema.  Pulmonary/Chest: Effort normal and breath sounds normal. No accessory muscle usage. No respiratory distress.  Abdominal: Soft. Bowel sounds are normal.  Musculoskeletal:        General: Normal range of motion.     Cervical back: Normal range of motion.  Neurological: He is alert and oriented to person, place, and time.  Skin: Skin is warm and dry.   Radiology: No results found.  Laboratory examination:   Recent Labs    06/30/19 1101  NA 139  K 4.4  CL 103  CO2 22  GLUCOSE 176*  BUN 10  CREATININE 1.13  CALCIUM 10.9*  GFRNONAA 72  GFRAA 84   CMP Latest Ref Rng & Units 06/30/2019 09/29/2016 09/28/2016  Glucose 65 - 99 mg/dL 09/30/2016) 115(B) 262(M)  BUN 6 - 24 mg/dL 10 6 6   Creatinine 0.76 - 1.27 mg/dL 355(H 7.41  Sodium 134 - 144 mmol/L 139 140 140  Potassium 3.5 - 5.2 mmol/L 4.4 3.8 3.6  Chloride 96 - 106 mmol/L 103 110 109  CO2 20 - 29 mmol/L 22 23 25   Calcium 8.7 - 10.2 mg/dL 10.9(H) 8.9 8.7(L)  Total Protein 6.0 - 8.5 g/dL 7.4 - -  Total Bilirubin 0.0 - 1.2 mg/dL 0.6 - -  Alkaline Phos 39 - 117 IU/L 81 - -  AST 0 - 40 IU/L 44(H) - -  ALT 0 - 44 IU/L 52(H) - -   CBC Latest Ref Rng & Units 06/30/2019 09/29/2016 09/28/2016  WBC 3.4 - 10.8 x10E3/uL 7.2 5.8 4.6  Hemoglobin 13.0 - 17.7 g/dL 07/02/2019 10/01/2016 09/30/2016  Hematocrit 37.5 - 51.0 % 43.6 40.6 41.4  Platelets 150 - 450 x10E3/uL 252 234 214   Lipid Panel     Component Value Date/Time   CHOL 132 06/30/2019 1059   TRIG 112 06/30/2019 1059   HDL 27 (L) 06/30/2019 1059   CHOLHDL 4.9 06/30/2019 1059   CHOLHDL 4.4 11/04/2010 0330   VLDL 19 11/04/2010 0330   LDLCALC 84 06/30/2019 1059   HEMOGLOBIN A1C Lab Results  Component Value Date   HGBA1C 8.3 (H) 06/30/2019   MPG 117 (H) 11/03/2010    TSH Recent Labs    06/30/19 1059  TSH 1.640   Medications   Current Outpatient Medications  Medication Instructions  . aspirin EC 81 mg, Oral, Daily  . BRILINTA 60 MG TABS tablet TAKE 1 TABLET BY MOUTH TWICE DAILY **NEED  OFFICE  VISIT  FOR  FURTHER  REFILLS**  . Cholecalciferol (VITAMIN D-3 PO) 1 capsule, Oral, Daily  . Coenzyme Q10 (CO Q-10 PO) 1 capsule, Oral, Daily  . Jardiance 10 mg, Oral, Daily before  breakfast  . lisinopril-hydrochlorothiazide (ZESTORETIC) 20-12.5 MG tablet 1 tablet, Oral,  Every morning - 10a  . metFORMIN (GLUCOPHAGE) 500 mg, Oral, Daily with breakfast  . metoprolol succinate (TOPROL-XL) 25 mg, Oral, Daily  . Omega-3 Fatty Acids (FISH OIL PO) 1 capsule, Oral, Daily  . rosuvastatin (CRESTOR) 10 MG tablet TAKE 1/2 TO 1 (ONE-HALF TO ONE) TABLET BY MOUTH AS DIRECTED    Cardiac Studies:   Treadmill stress test. 03/29/2013 Treadmill exercise stress test 03/29/2013: Bruce protocol, patient exercised for 9 minutes and 15 seconds, achieved 10.5 METS, did have chest discomfort associated with 1.5-2 mm inferior and lateral ST segment depression which resolved at greater than 2 minutes into recovery  PTCA and stent to distal RCA 02/02/2010 2.5x18 mm Promus DES. Coronary angio 11/14/2010: Mild diffuse CAD of all three vessels. Stent patent. Ramus intermediate small with mid 90% stenosis.  Assessment     ICD-10-CM   1. Atherosclerosis of native coronary artery of native heart without angina pectoris  I25.10 EKG 12-Lead  2. Uncontrolled type 2 diabetes mellitus with hyperglycemia (HCC)  E11.65 metFORMIN (GLUCOPHAGE) 500 MG tablet    empagliflozin (JARDIANCE) 10 MG TABS tablet    Ambulatory referral to Endocrinology  3. Primary hypertension  I10 BASIC METABOLIC PANEL WITH GFR  4. Hyperlipidemia LDL goal <70  E78.5 EKG 12-Lead  5. Snoring  R06.83 Ambulatory referral to Sleep Studies    EKG 07/02/2019: Normal sinus rhythm at the rate of 68 bpm, normal axis, no evidence  of ischemia, normal EKG.  Recommendations:   Joshua Phillips  is a 58 y.o.  with CAD S/P stenting to his distal right coronary artery in July 2011 for exertional angina pectoris. Patient last seen in May 2019. He has been feeling tired and fatigued and has been sleeping more but continues to remain active and exercises regularly without any chest pain or dyspnea.  He felt he may have got diabetes and wanted to be seen, underwent labs.  Otherwise remains asymptomatic.  Continues to have difficulty in weight loss.  Past medical history significant for hypertension, hyperlipidemia and hyperglycemia, now frankly diabetic.  Patient without any symptoms of angina pectoris with a normal EKG, main issues are new onset of diabetes mellitus probably related to his poor diet and weight gain and metabolic syndrome.  I have started him on Metformin 500 mg daily along with Jardiance 10 mg daily.  I will refer him to be evaluated by Carlus PavlovGHERGHE, CRISTINA, MD.  Calcium level is also elevated, will await their opinion. He has sleep disturbance and loud snoring and body habitus for sleep apnea, diabetes may be contributed by sleep apnea, I will refer him to be evaluated for sleep study with Dr. Vickey Hugerohmeier. Lipids are not at goal, will have LDL to less than 70, will consider increasing Crestor on his next office visit.  His blood pressure was elevated, I have increased and changed plain lisinopril to 20/12.5 mg lisinopril HCT.  BMP in 2 weeks.  I will see him in 2 months. He is presently on dual antiplatelet therapy as per history once, he feels safer, continue the same.  Yates DecampJay Latricia Cerrito, MD, [facility] HospitalFACC 07/03/2019, 7:14 AM [facility] [place] Pager: [phone] Office: [phone] If no answer Cell [phone]

## 2019-07-02 ENCOUNTER — Ambulatory Visit: Payer: BC Managed Care – PPO | Admitting: Cardiology

## 2019-07-02 ENCOUNTER — Other Ambulatory Visit: Payer: Self-pay

## 2019-07-02 ENCOUNTER — Encounter: Payer: Self-pay | Admitting: Cardiology

## 2019-07-02 VITALS — BP 142/93 | HR 74 | Temp 98.4°F | Ht 68.0 in | Wt 229.0 lb

## 2019-07-02 DIAGNOSIS — I10 Essential (primary) hypertension: Secondary | ICD-10-CM | POA: Diagnosis not present

## 2019-07-02 DIAGNOSIS — E785 Hyperlipidemia, unspecified: Secondary | ICD-10-CM | POA: Diagnosis not present

## 2019-07-02 DIAGNOSIS — E1165 Type 2 diabetes mellitus with hyperglycemia: Secondary | ICD-10-CM | POA: Diagnosis not present

## 2019-07-02 DIAGNOSIS — I2510 Atherosclerotic heart disease of native coronary artery without angina pectoris: Secondary | ICD-10-CM | POA: Diagnosis not present

## 2019-07-02 DIAGNOSIS — R0683 Snoring: Secondary | ICD-10-CM

## 2019-07-02 MED ORDER — METFORMIN HCL 500 MG PO TABS: 500.0000 mg | ORAL_TABLET | Freq: Every day | ORAL | 2 refills | Status: DC

## 2019-07-02 MED ORDER — JARDIANCE 10 MG PO TABS: 10.0000 mg | ORAL_TABLET | Freq: Every day | ORAL | 2 refills | Status: DC

## 2019-07-02 MED ORDER — LISINOPRIL-HYDROCHLOROTHIAZIDE 20-12.5 MG PO TABS: 1.0000 | ORAL_TABLET | Freq: Every morning | ORAL | 2 refills | Status: DC

## 2019-07-06 DIAGNOSIS — E1165 Type 2 diabetes mellitus with hyperglycemia: Secondary | ICD-10-CM | POA: Insufficient documentation

## 2019-07-06 DIAGNOSIS — E1159 Type 2 diabetes mellitus with other circulatory complications: Secondary | ICD-10-CM | POA: Insufficient documentation

## 2019-07-06 NOTE — Patient Instructions (Addendum)
Please continue: - Metformin 500 mg daily but move this to dinnertime - Jardiance 10 mg daily before breakfast  Stay well hydrated.  Please let me know if the sugars are consistently <80 or >180.  Please return in 2.5-3 months with your sugar log.  PATIENT INSTRUCTIONS FOR TYPE 2 DIABETES:  DIET AND EXERCISE Diet and exercise is an important part of diabetic treatment.  We recommended aerobic exercise in the form of brisk walking (working between 40-60% of maximal aerobic capacity, similar to brisk walking) for 150 minutes per week (such as 30 minutes five days per week) along with 3 times per week performing 'resistance' training (using various gauge rubber tubes with handles) 5-10 exercises involving the major muscle groups (upper body, lower body and core) performing 10-15 repetitions (or near fatigue) each exercise. Start at half the above goal but build slowly to reach the above goals. If limited by weight, joint pain, or disability, we recommend daily walking in a swimming pool with water up to waist to reduce pressure from joints while allow for adequate exercise.    BLOOD GLUCOSES Monitoring your blood glucoses is important for continued management of your diabetes. Please check your blood glucoses 2-4 times a day: fasting, before meals and at bedtime (you can rotate these measurements - e.g. one day check before the 3 meals, the next day check before 2 of the meals and before bedtime, etc.).   HYPOGLYCEMIA (low blood sugar) Hypoglycemia is usually a reaction to not eating, exercising, or taking too much insulin/ other diabetes drugs.  Symptoms include tremors, sweating, hunger, confusion, headache, etc. Treat IMMEDIATELY with 15 grams of Carbs: . 4 glucose tablets .  cup regular juice/soda . 2 tablespoons raisins . 4 teaspoons sugar . 1 tablespoon honey Recheck blood glucose in 15 mins and repeat above if still symptomatic/blood glucose <100.  RECOMMENDATIONS TO REDUCE YOUR  RISK OF DIABETIC COMPLICATIONS: * Take your prescribed MEDICATION(S) * Follow a DIABETIC diet: Complex carbs, fiber rich foods, (monounsaturated and polyunsaturated) fats * AVOID saturated/trans fats, high fat foods, >2,300 mg salt per day. * EXERCISE at least 5 times a week for 30 minutes or preferably daily.  * DO NOT SMOKE OR DRINK more than 1 drink a day. * Check your FEET every day. Do not wear tightfitting shoes. Contact us if you develop an ulcer * See your EYE doctor once a year or more if needed * Get a FLU shot once a year * Get a PNEUMONIA vaccine once before and once after age 66 years  GOALS:  * Your Hemoglobin A1c of <7%  * fasting sugars need to be <130 * after meals sugars need to be <180 (2h after you start eating) * Your Systolic BP should be 140 or lower  * Your Diastolic BP should be 80 or lower  * Your HDL (Good Cholesterol) should be 40 or higher  * Your LDL (Bad Cholesterol) should be 100 or lower. * Your Triglycerides should be 150 or lower  * Your Urine microalbumin (kidney function) should be <30 * Your Body Mass Index should be 25 or lower    Please consider the following ways to cut down carbs and fat and increase fiber and micronutrients in your diet: - substitute whole grain for white bread or pasta - substitute brown rice for white rice - substitute 90-calorie flat bread pieces for slices of bread when possible - substitute sweet potatoes or yams for white potatoes - substitute humus for margarine -  substitute tofu for cheese when possible - substitute almond or rice milk for regular milk (would not drink soy milk daily due to concern for soy estrogen influence on breast cancer risk) - substitute dark chocolate for other sweets when possible - substitute water - can add lemon or orange slices for taste - for diet sodas (artificial sweeteners will trick your body that you can eat sweets without getting calories and will lead you to overeating and weight  gain in the long run) - do not skip breakfast or other meals (this will slow down the metabolism and will result in more weight gain over time)  - can try smoothies made from fruit and almond/rice milk in am instead of regular breakfast - can also try old-fashioned (not instant) oatmeal made with almond/rice milk in am - order the dressing on the side when eating salad at a restaurant (pour less than half of the dressing on the salad) - eat as little meat as possible - can try juicing, but should not forget that juicing will get rid of the fiber, so would alternate with eating raw veg./fruits or drinking smoothies - use as little oil as possible, even when using olive oil - can dress a salad with a mix of balsamic vinegar and lemon juice, for e.g. - use agave nectar, stevia sugar, or regular sugar rather than artificial sweateners - steam or broil/roast veggies  - snack on veggies/fruit/nuts (unsalted, preferably) when possible, rather than processed foods - reduce or eliminate aspartame in diet (it is in diet sodas, chewing gum, etc) Read the labels!  Try to read Dr. Janene Harvey book: "Program for Reversing Diabetes" for other ideas for healthy eating.

## 2019-07-06 NOTE — Progress Notes (Signed)
Patient ID: YANNI QUIROA, male   DOB: 03-29-1961, 58 y.o.   MRN: [id number]   This visit occurred during the SARS-CoV-2 public health emergency.  Safety protocols were in place, including screening questions prior to the visit, additional usage of staff PPE, and extensive cleaning of exam room while observing appropriate contact time as indicated for disinfecting solutions.   HPI: BALIN VANDEGRIFT is a 58 y.o.-year-old male, referred by his cardiologist, Dr. Jacinto Halim, for management of DM2, dx in 06/2019, non-insulin-dependent, uncontrolled, with complications (CAD s/p stent 01/2010).  He does 2-3 week detox treatments in [place] 2x a year ([facility]). During these periods >> she loses weight and feels better  Lately, during the coronavirus pandemic, he could not exercise, was less active, and ate more >> started to feel poorly >> saw Dr. Jacinto Halim and had a HbA1c level >> returned high.  He was started on Metformin and Jardiance and referred to endocrinology.  Reviewed latest HbA1c levels: Lab Results  Component Value Date   HGBA1C 8.3 (H) 06/30/2019   HGBA1C (H) 11/03/2010    5.7 (NOTE)                                                                       According to the ADA Clinical Practice Recommendations for 2011, when HbA1c is used as a screening test:   >=6.5%   Diagnostic of Diabetes Mellitus           (if abnormal result  is confirmed)  5.7-6.4%   Increased risk of developing Diabetes Mellitus  References:Diagnosis and Classification of Diabetes Mellitus,Diabetes Care,2011,34(Suppl 1):S62-S69 and Standards of Medical Care in         Diabetes - 2011,Diabetes Care,2011,34  (Suppl 1):S11-S61.   Pt was started on the following regimen at last visit with Dr. Jacinto Halim on 07/02/2019: - Metformin 500 mg 1x a day, with b'fast - Jardiance 10 mg daily in am  Pt checks his sugars rarely, maybe once a month. - am: n/c - 2h after b'fast: n/c - before lunch: n/c - 2h after lunch: n/c - before dinner:  n/c - 2h after dinner: n/c - bedtime: n/c - nighttime: n/c Lowest sugar was 74; ? hypoglycemia awareness. Highest sugar was 180.  Glucometer: Contour Next  Pt's meals are: - Breakfast: tea + millet bread or oatmeal or cereal or eggs (rarely) - Lunch: salad or sandwich - Dinner: veggies + flat breads, chicken/fish/beef; occas rice - Snacks: 1 alcoholic drink a day + diet soda; nuts; indian snack He has difficulty losing weight despite exercising regularly.  - no CKD, last BUN/creatinine:  Lab Results  Component Value Date   BUN 10 06/30/2019   BUN 6 09/29/2016   CREATININE 1.13 06/30/2019   CREATININE 0.84 09/29/2016  On lisinopril 20.  -+ HL; last set of lipids: Lab Results  Component Value Date   CHOL 132 06/30/2019   HDL 27 (L) 06/30/2019   LDLCALC 84 06/30/2019   TRIG 112 06/30/2019   CHOLHDL 4.9 06/30/2019  On Crestor 5+ fish oil. On ASA 81.  - last eye exam was in 2018. No DR. + xerophthalmia.  - no numbness and tingling in his feet.  Pt has FH of DM in father.  Mother  had PMR.  He also has a history of HTN.  On last set of labs he also had a slightly high calcium, previous calcium levels have been normal or low: Lab Results  Component Value Date   CALCIUM 10.9 (H) 06/30/2019   CALCIUM 8.9 09/29/2016   CALCIUM 8.7 (L) 09/28/2016   CALCIUM 9.0 09/26/2016   CALCIUM 8.3 (L) 09/23/2016   CALCIUM 9.0 11/04/2010   CALCIUM 8.9 11/03/2010   CALCIUM 9.2 11/03/2010   CALCIUM 9.1 02/03/2010   No results found for: VD25OH   He has fatigue.  ROS: Constitutional: + Weight gain, no weight loss, + fatigue, no subjective hyperthermia, no subjective hypothermia, no nocturia, + excessive urination Eyes: + Blurry vision, no xerophthalmia ENT: no sore throat, no nodules palpated in neck, no dysphagia, no odynophagia, no hoarseness, + tinnitus, + hypoacusis Cardiovascular: no CP, no SOB, no palpitations, no leg swelling Respiratory: no cough, no SOB, no  wheezing Gastrointestinal: no N, no V, no D, no C, + acid reflux Musculoskeletal: no muscle, no joint aches Skin: no rash, no hair loss Neurological: no tremors, no numbness or tingling/no dizziness/no HAs Psychiatric: no depression, no anxiety  Past Medical History:  Diagnosis Date  . Hypertension    Past Surgical History:  Procedure Laterality Date  . CARDIAC CATHETERIZATION    . CARDIAC SURGERY     stent placement   Social History   Socioeconomic History  . Marital status: Married    Spouse name: Not on file  . Number of children: 2  . Years of education: Not on file  . Highest education level: Not on file  Occupational History  . Not on file  Tobacco Use  . Smoking status: Former Smoker    Packs/day: 0.25    Years: 10.00    Pack years: 2.50    Quit date: 1997    Years since quitting: 23.9  . Smokeless tobacco: Never Used  Substance and Sexual Activity  . Alcohol use: Yes    Comment: occ  . Drug use: No  . Sexual activity: Yes  Other Topics Concern  . Not on file  Social History Narrative  . Not on file   Social Determinants of Health   Financial Resource Strain:   . Difficulty of Paying Living Expenses: Not on file  Food Insecurity:   . Worried About Programme researcher, broadcasting/film/video in the Last Year: Not on file  . Ran Out of Food in the Last Year: Not on file  Transportation Needs:   . Lack of Transportation (Medical): Not on file  . Lack of Transportation (Non-Medical): Not on file  Physical Activity:   . Days of Exercise per Week: Not on file  . Minutes of Exercise per Session: Not on file  Stress:   . Feeling of Stress : Not on file  Social Connections:   . Frequency of Communication with Friends and Family: Not on file  . Frequency of Social Gatherings with Friends and Family: Not on file  . Attends Religious Services: Not on file  . Active Member of Clubs or Organizations: Not on file  . Attends Banker Meetings: Not on file  . Marital  Status: Not on file  Intimate Partner Violence:   . Fear of Current or Ex-Partner: Not on file  . Emotionally Abused: Not on file  . Physically Abused: Not on file  . Sexually Abused: Not on file   Current Outpatient Medications on File Prior to Visit  Medication  Sig Dispense Refill  . aspirin EC 81 MG tablet Take 81 mg by mouth daily.    Marland Kitchen BRILINTA 60 MG TABS tablet TAKE 1 TABLET BY MOUTH TWICE DAILY **NEED  OFFICE  VISIT  FOR  FURTHER  REFILLS** (Patient taking differently: Take 60 mg by mouth 2 (two) times daily. ) 180 tablet 0  . Cholecalciferol (VITAMIN D-3 PO) Take 1 capsule by mouth daily.    . Coenzyme Q10 (CO Q-10 PO) Take 1 capsule by mouth daily.    . empagliflozin (JARDIANCE) 10 MG TABS tablet Take 10 mg by mouth daily before breakfast. 30 tablet 2  . lisinopril-hydrochlorothiazide (ZESTORETIC) 20-12.5 MG tablet Take 1 tablet by mouth every morning. 30 tablet 2  . metFORMIN (GLUCOPHAGE) 500 MG tablet Take 1 tablet (500 mg total) by mouth daily with breakfast. 30 tablet 2  . metoprolol succinate (TOPROL-XL) 25 MG 24 hr tablet Take 25 mg by mouth daily.    . Omega-3 Fatty Acids (FISH OIL PO) Take 1 capsule by mouth daily.    . rosuvastatin (CRESTOR) 10 MG tablet TAKE 1/2 TO 1 (ONE-HALF TO ONE) TABLET BY MOUTH AS DIRECTED (Patient taking differently: 1/2 tab qd) 90 tablet 0   No current facility-administered medications on file prior to visit.   No Known Allergies Family History  Problem Relation Age of Onset  . Heart attack Father   . Diabetes Father   . Hypertension Mother   . Stroke Mother     PE: BP 118/80   Pulse 71   Ht 5\' 8"  (1.727 m)   Wt 228 lb (103.4 kg)   SpO2 96%   BMI 34.67 kg/m  Wt Readings from Last 3 Encounters:  07/07/19 228 lb (103.4 kg)  07/02/19 229 lb (103.9 kg)  09/23/16 190 lb (86.2 kg)   Constitutional: overweight, in NAD Eyes: PERRLA, EOMI, no exophthalmos ENT: moist mucous membranes, no thyromegaly, no cervical  lymphadenopathy Cardiovascular: RRR, No MRG Respiratory: CTA B Gastrointestinal: abdomen soft, NT, ND, BS+ Musculoskeletal: no deformities, strength intact in all 4 Skin: moist, warm, no rashes Neurological: no tremor with outstretched hands, DTR normal in all 4  ASSESSMENT: 1. DM2, non-insulin-dependent, uncontrolled, with complications - CAD, s/p stent 2011  PLAN:  1. Patient with long-standing, uncontrolled diabetes, just started on oral antidiabetic regimen, with low-dose Metformin and SGLT2 inhibitor.  He tolerates these well. - Patient describes that in the last few months, during the current pandemic, he ate more, was less active and he started to feel poorly.  He saw Dr. Einar Gip check an HbA1c and this returned elevated, at 8.3%.  He was started on a low-dose of the morning and Jardiance and referred to endocrinology.  He tolerates the medications well but feels different on that.  He feels that his capacity to exercise may be lower and his feet feel cold.  We discussed that this may be due to dehydration and I advised him to stay well-hydrated.  Along with Jardiance, he was also started on 12.5 mg HCTZ, and we discussed that if he still feels poorly after increasing his fluid intake, he may need to check with Dr. Einar Gip if he can stop the HCTZ for a period of time.  At today's visit, blood pressure is excellent. - For now, we discussed about ways to improve his diet to reduce his insulin resistance.  Explained the root of insulin resistance and made specific suggestions about a lower fat diet. - I also suggested to Metformin with dinner, since  this is a more substantial meal for him and also because of the progression of Metformin effect on liver gluconeogenesis overnight.  We will continue Jardiance in the morning.  - I advised him to start checking sugars and to let me know before next visit if the sugars are not under our target.  In that case, we may increase Metformin, Jardiance, or  both. - I suggested to:  Patient Instructions  Please continue: - Metformin 500 mg daily but move this to dinnertime - Jardiance 10 mg daily before breakfast  Stay well hydrated.  Please let me know if the sugars are consistently <80 or >180.  Please return in 2.5-3 months with your sugar log.  - Strongly advised him to start checking sugars at different times of the day - check 1x a day, rotating checks - discussed about CBG targets for treatment: 80-130 mg/dL before meals and <161<180 mg/dL after meals; target [id number] <7%. - given sugar log and advised how to fill it and to bring it at next appt  - given foot care handout and explained the principles  - given instructions for hypoglycemia management "15-15 rule"  - advised for yearly eye exams  - Return to clinic in 2.5-3 mo with sugar log   Carlus Pavlovristina Theo Reither, MD PhD [facility]

## 2019-07-07 ENCOUNTER — Other Ambulatory Visit: Payer: Self-pay

## 2019-07-07 ENCOUNTER — Telehealth: Payer: Self-pay | Admitting: Internal Medicine

## 2019-07-07 ENCOUNTER — Encounter: Payer: Self-pay | Admitting: Internal Medicine

## 2019-07-07 ENCOUNTER — Ambulatory Visit: Payer: BLUE CROSS/BLUE SHIELD | Admitting: Internal Medicine

## 2019-07-07 VITALS — BP 118/80 | HR 71 | Ht 68.0 in | Wt 228.0 lb

## 2019-07-07 DIAGNOSIS — E1165 Type 2 diabetes mellitus with hyperglycemia: Secondary | ICD-10-CM

## 2019-07-07 DIAGNOSIS — E1159 Type 2 diabetes mellitus with other circulatory complications: Secondary | ICD-10-CM | POA: Diagnosis not present

## 2019-07-07 MED ORDER — JARDIANCE 10 MG PO TABS: 10.0000 mg | ORAL_TABLET | Freq: Every day | ORAL | 3 refills | Status: DC

## 2019-07-07 MED ORDER — CONTOUR NEXT TEST VI STRP: ORAL_STRIP | 12 refills | Status: DC

## 2019-07-07 MED ORDER — METFORMIN HCL 500 MG PO TABS: 500.0000 mg | ORAL_TABLET | Freq: Every day | ORAL | 3 refills | Status: DC

## 2019-07-07 NOTE — Telephone Encounter (Signed)
RX sent

## 2019-07-07 NOTE — Telephone Encounter (Signed)
MEDICATION: Contour Next Test Strips  PHARMACY:  [facility] [place] :  yes  IS PATIENT OUT OF MEDICATION: new meter to patient   IF NOT; HOW MUCH IS LEFT: new meter to patient   LAST APPOINTMENT DATE: @12 /23/2020  NEXT APPOINTMENT DATE:@4 /11/2019  DO WE HAVE YOUR PERMISSION TO LEAVE A DETAILED MESSAGE: yes  OTHER COMMENTS:    **Let patient know to contact pharmacy at the end of the day to make sure medication is ready. **  ** Please notify patient to allow 48-72 hours to process**  **Encourage patient to contact the pharmacy for refills or they can request refills through [facility]**

## 2019-07-20 ENCOUNTER — Telehealth: Payer: Self-pay

## 2019-07-20 ENCOUNTER — Institutional Professional Consult (permissible substitution): Payer: Self-pay | Admitting: Neurology

## 2019-07-20 NOTE — Telephone Encounter (Signed)
Spoke with the patient and he stated that he would rather have a in office visit with Dohmeier. I advised him that Sheena from our sleep lab would contact him to r/s his in office visit.

## 2019-07-23 ENCOUNTER — Other Ambulatory Visit: Payer: Self-pay | Admitting: Cardiology

## 2019-07-23 DIAGNOSIS — I10 Essential (primary) hypertension: Secondary | ICD-10-CM | POA: Diagnosis not present

## 2019-07-23 LAB — HM DIABETES EYE EXAM

## 2019-07-24 LAB — BASIC METABOLIC PANEL
BUN/Creatinine Ratio: 10 (ref 9–20)
BUN: 11 mg/dL (ref 6–24)
CO2: 22 mmol/L (ref 20–29)
Calcium: 10 mg/dL (ref 8.7–10.2)
Chloride: 103 mmol/L (ref 96–106)
Creatinine, Ser: 1.06 mg/dL (ref 0.76–1.27)
GFR calc Af Amer: 90 mL/min/{1.73_m2} (ref >59)
GFR calc non Af Amer: 78 mL/min/{1.73_m2} (ref >59)
Glucose: 180 mg/dL — ABNORMAL HIGH (ref 65–99)
Potassium: 4.4 mmol/L (ref 3.5–5.2)
Sodium: 142 mmol/L (ref 134–144)

## 2019-07-26 ENCOUNTER — Other Ambulatory Visit: Payer: Self-pay

## 2019-07-26 MED ORDER — METOPROLOL SUCCINATE ER 25 MG PO TB24: 25.0000 mg | ORAL_TABLET | Freq: Every day | ORAL | 3 refills | Status: DC

## 2019-07-27 ENCOUNTER — Other Ambulatory Visit: Payer: Self-pay

## 2019-07-27 ENCOUNTER — Encounter: Payer: Self-pay | Admitting: Neurology

## 2019-07-27 ENCOUNTER — Ambulatory Visit: Payer: BC Managed Care – PPO | Admitting: Neurology

## 2019-07-27 VITALS — BP 122/82 | HR 65 | Temp 97.0°F | Ht 68.0 in | Wt 229.0 lb

## 2019-07-27 DIAGNOSIS — E1159 Type 2 diabetes mellitus with other circulatory complications: Secondary | ICD-10-CM | POA: Diagnosis not present

## 2019-07-27 DIAGNOSIS — E6609 Other obesity due to excess calories: Secondary | ICD-10-CM | POA: Diagnosis not present

## 2019-07-27 DIAGNOSIS — E1165 Type 2 diabetes mellitus with hyperglycemia: Secondary | ICD-10-CM

## 2019-07-27 DIAGNOSIS — G478 Other sleep disorders: Secondary | ICD-10-CM | POA: Diagnosis not present

## 2019-07-27 DIAGNOSIS — R0683 Snoring: Secondary | ICD-10-CM

## 2019-07-27 DIAGNOSIS — Z6834 Body mass index (BMI) 34.0-34.9, adult: Secondary | ICD-10-CM

## 2019-07-27 NOTE — Progress Notes (Signed)
SLEEP MEDICINE CLINIC    Provider:  Melvyn Novas, MD  Primary Care Physician:     Referring Provider: Yates Decamp, MD , Cardiology        Chief Complaint according to patient   Patient presents with:    . New Patient (Initial Visit)           HISTORY OF PRESENT ILLNESS:  Joshua Phillips is a 59 y.o. year old [place] born male patient and seen upon referral by Dr Jacinto Phillips on 07/27/2019.   Chief concern according to patient : " I have solar urticaria , restricting my outdoor time, especially golfing. Joshua Phillips is involved with [employer] and brought Joshua Phillips to sponsor the golf tournament.    I have the pleasure of seeing Joshua Phillips today, a right -handed Asian male with a possible sleep disorder. He  has a past medical history of Hypertension. Diabetes and treated with Metformin and Jardiane. Had been on Belviq. GERD. Solar urticaria and recent weight gain. He had an MI in 2011, and is followed by Dr Jacinto Phillips for CAD since.  55% blockage in 2011 by angiogram. He had kidney stones that year, too. .  The patient had a first sleep study in the year 1990 as a volunteer - in a research study. No apnea at that time.    Sleep relevant medical history: Nocturia once at night . Family medical /sleep history: father with CAD.    Social history:  Patient is working as a Public librarian for [employer] and travels a lot- he has not had trouble with jet lag. He  lives in a household with spouse, 2 children are in college. Wife built a new house just 2 years ago.  The patient currently works: all the time, not regular.  Tobacco use; he quit almost 25 years ago.  ETOH use : 2 drinks a week-. Caffeine intake in form of Coffee( none ) Soda( some with vodka) Tea ( hot tea , in AM ) or energy drinks. No dairy.  Regular exercise in form of golf.     Sleep habits are as follows: The patient's dinner time is between 6 PM. The patient goes to bed at 10-12 PM and continues to sleep for 4-6 hours,  wakes for 1 bathroom break. He spends the evening watching Tv in the den. The preferred sleep position is laterally, with the support of 1 pillow.  Dreams are reportedly frequent, often contain work related issues. Marland Kitchen   6-7 AM is the usual rise time. The patient wakes up spontaneously before his alarm.  He reports he is feeling refreshed or restored in AM, has a morning exercise regimen- no morning headaches, or residual fatigue.  Naps were taken frequently, but this changed with diabetes treatments- now he has energy, naps were lasting from to 1-2 hours .    Review of Systems: Out of a complete 14 system review, the patient complains of only the following symptoms, and all other reviewed systems are negative.:  Fatigue, sleepiness , snoring, fragmented sleep, weight gain.    How likely are you to doze in the following situations: 0 = not likely, 1 = slight chance, 2 = moderate chance, 3 = high chance   Sitting and Reading? Watching Television? Sitting inactive in a public place (theater or meeting)? As a passenger in a car for an hour without a break? Lying down in the afternoon when circumstances permit? Sitting and talking to someone? Sitting  quietly after lunch without alcohol? In a car, while stopped for a few minutes in traffic?   Total = 3/ 24 points     Social History   Socioeconomic History  . Marital status: Married    Spouse name: Not on file  . Number of children: 2  . Years of education: Not on file  . Highest education level: Not on file  Occupational History  . Not on file  Tobacco Use  . Smoking status: Former Smoker    Packs/day: 0.25    Years: 10.00    Pack years: 2.50    Quit date: 1997    Years since quitting: 24.0  . Smokeless tobacco: Never Used  Substance and Sexual Activity  . Alcohol use: Yes    Comment: occ  . Drug use: No  . Sexual activity: Yes  Other Topics Concern  . Not on file  Social History Narrative  . Not on file   Social  Determinants of Health   Financial Resource Strain:   . Difficulty of Paying Living Expenses: Not on file  Food Insecurity:   . Worried About Programme researcher, broadcasting/film/video in the Last Year: Not on file  . Ran Out of Food in the Last Year: Not on file  Transportation Needs:   . Lack of Transportation (Medical): Not on file  . Lack of Transportation (Non-Medical): Not on file  Physical Activity:   . Days of Exercise per Week: Not on file  . Minutes of Exercise per Session: Not on file  Stress:   . Feeling of Stress : Not on file  Social Connections:   . Frequency of Communication with Friends and Family: Not on file  . Frequency of Social Gatherings with Friends and Family: Not on file  . Attends Religious Services: Not on file  . Active Member of Clubs or Organizations: Not on file  . Attends Banker Meetings: Not on file  . Marital Status: Not on file    Family History  Problem Relation Age of Onset  . Heart attack Father   . Diabetes Father   . Hypertension Mother   . Stroke Mother     Past Medical History:  Diagnosis Date  . Hypertension     Past Surgical History:  Procedure Laterality Date  . CARDIAC CATHETERIZATION    . CARDIAC SURGERY     stent placement     Current Outpatient Medications on File Prior to Visit  Medication Sig Dispense Refill  . aspirin EC 81 MG tablet Take 81 mg by mouth daily.    Marland Kitchen BRILINTA 60 MG TABS tablet TAKE 1 TABLET BY MOUTH TWICE DAILY **NEED  OFFICE  VISIT  FOR  FURTHER  REFILLS** (Patient taking differently: Take 60 mg by mouth 2 (two) times daily. ) 180 tablet 0  . Cholecalciferol (VITAMIN D-3 PO) Take 1 capsule by mouth daily.    . Coenzyme Q10 (CO Q-10 PO) Take 1 capsule by mouth daily.    . empagliflozin (JARDIANCE) 10 MG TABS tablet Take 10 mg by mouth daily before breakfast. 90 tablet 3  . glucose blood (CONTOUR NEXT TEST) test strip Use as instructed to check blood sugar once a day 100 each 12  .  lisinopril-hydrochlorothiazide (ZESTORETIC) 20-12.5 MG tablet Take 1 tablet by mouth every morning. 30 tablet 2  . metFORMIN (GLUCOPHAGE) 500 MG tablet Take 1 tablet (500 mg total) by mouth daily with supper. 90 tablet 3  . metoprolol succinate (TOPROL-XL) 25  MG 24 hr tablet Take 1 tablet (25 mg total) by mouth daily. 90 tablet 3  . Omega-3 Fatty Acids (FISH OIL PO) Take 1 capsule by mouth daily.    . rosuvastatin (CRESTOR) 10 MG tablet TAKE 1/2 TO 1 (ONE-HALF TO ONE) TABLET BY MOUTH AS DIRECTED (Patient taking differently: 1/2 tab qd) 90 tablet 0   No current facility-administered medications on file prior to visit.    No Known Allergies  Physical exam:  Today's Vitals   07/27/19 1259  BP: 122/82  Pulse: 65  Temp: (!) 97 F (36.1 C)  Weight: 229 lb (103.9 kg)  Height: 5\' 8"  (1.727 m)   Body mass index is 34.82 kg/m.   Wt Readings from Last 3 Encounters:  07/27/19 229 lb (103.9 kg)  07/07/19 228 lb (103.4 kg)  07/02/19 229 lb (103.9 kg)     Ht Readings from Last 3 Encounters:  07/27/19 5\' 8"  (1.727 m)  07/07/19 5\' 8"  (1.727 m)  07/02/19 5\' 8"  (1.727 m)      General: The patient is awake, alert and appears not in acute distress. The patient is well groomed. Head: Normocephalic, atraumatic. Neck is supple. Mallampati 3,  neck circumference: 19 inches . Nasal airflow  patent.  Retrognathia is seen, high grade.  Dental status:  Cardiovascular:  Regular rate and cardiac rhythm by pulse,  without distended neck veins. Respiratory: Lungs are clear to auscultation.  Skin:  Without evidence of ankle edema, or rash. Trunk: The patient's posture is erect.   Neurologic exam : The patient is awake and alert, oriented to place and time.   Memory subjective described as intact.  Attention span & concentration ability appears normal.  Speech is fluent,  without  dysarthria, dysphonia or aphasia.  Mood and affect are appropriate.   Cranial nerves: no loss of smell or taste  reported  Pupils are equal and briskly reactive to light. Funduscopic exam deferred. .  Extraocular movements in vertical and horizontal planes were intact and without nystagmus. No Diplopia. Visual fields by finger perimetry are intact. Hearing was intact to soft voice and finger rubbing.    Facial sensation intact to fine touch.  Facial motor strength is symmetric and tongue and uvula move midline.  Neck ROM : rotation, tilt and flexion extension were normal for age and shoulder shrug was symmetrical.    Motor exam:  Symmetric bulk, tone and ROM.   Normal tone without cog wheeling, symmetric grip strength .   Sensory:  Fine touch, pinprick and vibration were tested  and  normal.  Proprioception tested in the upper extremities was normal.  Coordination: Rapid alternating movements in the fingers/hands were of normal speed.  The Finger-to-nose maneuver was intact without evidence of ataxia, dysmetria or tremor.  Gait and station: Patient  walked without assistive device.  Stance is of normal width/ base and the patient turned with 3 steps.  Toe and heel walk were deferred.  Deep tendon reflexes: in the  upper and lower extremities are symmetric and intact.  Babinski response was deferred.        After spending a total time of  40 minutes face to face and additional time for physical and neurologic examination, review of laboratory studies,  personal review of imaging studies, reports and results of other testing and review of referral information / records as far as provided in visit, I have established the following assessments:  1) Joshua. Phillips has been at high risk for OSA, he snores,  gained weight, developed DM, and had CAD at a young age. Joshua. Phillips has witnessed him to gag or gasp.  He is asleep immediately when going to bed.    My Plan is to proceed with:  1) HST or attended sleep study are both options to be entertained. He prefers HST.   I would like to thank Dr. Jacinto Phillips  for  allowing me to meet with and to take care of this pleasant patient.   In short, Joshua Phillips is presenting with EDS.  I plan to follow up either personally or through our NP within 2-3  month.   CC: I will share my notes with PCP as well. .  Electronically signed by: Melvyn Novas, MD 07/27/2019 1:15 PM  [facility] Board certified by The [employer] of Sleep Medicine and Diplomate of the [employer] of Sleep Medicine. Board certified In Neurology through the ABPN, Fellow of the [employer] of Neurology. Medical Director of [facility].

## 2019-07-27 NOTE — Patient Instructions (Signed)

## 2019-08-02 ENCOUNTER — Encounter: Payer: Self-pay | Admitting: Internal Medicine

## 2019-08-23 ENCOUNTER — Other Ambulatory Visit: Payer: Self-pay

## 2019-08-23 ENCOUNTER — Ambulatory Visit (INDEPENDENT_AMBULATORY_CARE_PROVIDER_SITE_OTHER): Payer: BC Managed Care – PPO | Admitting: Neurology

## 2019-08-23 DIAGNOSIS — G4733 Obstructive sleep apnea (adult) (pediatric): Secondary | ICD-10-CM | POA: Diagnosis not present

## 2019-08-23 DIAGNOSIS — R0683 Snoring: Secondary | ICD-10-CM

## 2019-08-23 DIAGNOSIS — G478 Other sleep disorders: Secondary | ICD-10-CM

## 2019-08-23 DIAGNOSIS — E6609 Other obesity due to excess calories: Secondary | ICD-10-CM

## 2019-08-23 DIAGNOSIS — I25119 Atherosclerotic heart disease of native coronary artery with unspecified angina pectoris: Secondary | ICD-10-CM

## 2019-08-23 DIAGNOSIS — Z6834 Body mass index (BMI) 34.0-34.9, adult: Secondary | ICD-10-CM

## 2019-08-23 DIAGNOSIS — E1159 Type 2 diabetes mellitus with other circulatory complications: Secondary | ICD-10-CM

## 2019-08-23 DIAGNOSIS — E1165 Type 2 diabetes mellitus with hyperglycemia: Secondary | ICD-10-CM

## 2019-08-27 ENCOUNTER — Other Ambulatory Visit: Payer: Self-pay | Admitting: Cardiology

## 2019-09-01 ENCOUNTER — Telehealth: Payer: Self-pay | Admitting: Neurology

## 2019-09-01 DIAGNOSIS — Z6834 Body mass index (BMI) 34.0-34.9, adult: Secondary | ICD-10-CM | POA: Insufficient documentation

## 2019-09-01 DIAGNOSIS — R0683 Snoring: Secondary | ICD-10-CM | POA: Insufficient documentation

## 2019-09-01 DIAGNOSIS — E6609 Other obesity due to excess calories: Secondary | ICD-10-CM

## 2019-09-01 DIAGNOSIS — E1159 Type 2 diabetes mellitus with other circulatory complications: Secondary | ICD-10-CM

## 2019-09-01 DIAGNOSIS — I25119 Atherosclerotic heart disease of native coronary artery with unspecified angina pectoris: Secondary | ICD-10-CM

## 2019-09-01 DIAGNOSIS — G4733 Obstructive sleep apnea (adult) (pediatric): Secondary | ICD-10-CM

## 2019-09-01 DIAGNOSIS — E1165 Type 2 diabetes mellitus with hyperglycemia: Secondary | ICD-10-CM

## 2019-09-01 DIAGNOSIS — G478 Other sleep disorders: Secondary | ICD-10-CM

## 2019-09-01 NOTE — Procedures (Signed)
No note

## 2019-09-01 NOTE — Telephone Encounter (Signed)
-----   Message from Melvyn Novas, MD sent at 09/01/2019  4:39 PM EST ----- Moderate -severe obstructive sleep apnea at AHI of 25.8/h with accentuation in REM sleep to 31.2/h. There is a trend to bradycardia noted, no hypoxemia events were correlated   Recommendations:    CPAP or Inspire procedure are both options of treatment for this type and degree of apnea.  I recommend CPAP by autotitration between 5 cm and 15 cm water pressure with mask of choice and heated humidity. The patient will also inquire at his DME about travel friendly CPAPs.   Interpreting Physician:  Melvyn Novas, MD  08-31-2019  Full report will be scanned under media tap.

## 2019-09-01 NOTE — Telephone Encounter (Signed)
Pt called asking about results from hst. Please advise.

## 2019-09-01 NOTE — Progress Notes (Signed)
Moderate -severe obstructive sleep apnea at AHI of 25.8/h with accentuation in REM sleep to 31.2/h. There is a trend to bradycardia noted, no hypoxemia events were correlated   Recommendations:    CPAP or Inspire procedure are both options of treatment for this type and degree of apnea.  I recommend CPAP by autotitration between 5 cm and 15 cm water pressure with mask of choice and heated humidity. The patient will also inquire at his DME about travel friendly CPAPs.   Interpreting Physician:  Melvyn Novas, MD  08-31-2019  Full report will be scanned under media tap.

## 2019-09-01 NOTE — Telephone Encounter (Signed)
Patient Information     First Name: Joshua Phillips. Last Name: Joshua Phillips ID: [id number]  Birth Date: 10/20/1960 Age: 59 Gender: Male  Referring Provider: Yates Decamp, MD BMI: 34.7 (W=229 lb, H=5' 8'')  Neck Circ.:  19 '' Epworth:  03/24   Sleep Study Information    Study Date: Aug 24, 2019 S/H/A Version: [id number] / 4.1.1528 / 76  History:    Joshua Phillips is a 59 y.o. year old [place] born gentleman and seen upon referral by Dr Joshua Phillips on 07/27/2019.  Chief concern according to patient: " I have solar urticaria, restricting my outdoor time, especially golfing. I have CAD, DM, HLD, and HTN, and I gained weight. There is a concern that I developed apnea " . His wife has witnessed apnoeic events. 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 He has a medical history of Hypertension. Diabetes is treated with Metformin and Jardiane. Obesity: Had been on Belviq. GERD. Solar urticaria and recent weight gain. He had an MI in 2011 and is followed by Dr Joshua Phillips since - he had a 55% blockage in 2011 LAD by angiogram. He had kidney stones that year, too. The patient had a first sleep study in the year 1990 as a volunteer participant  in a research study. No apnea at that time.   Summary & Diagnosis:    Moderate -severe obstructive sleep apnea at AHI of 25.8/h with accentuation in REM sleep to 31.2/h. There is a trend to bradycardia noted, no hypoxemia events were correlated   Recommendations:     CPAP or Inspire procedure are both options of treatment for this type and degree of apnea.  I recommend CPAP by autotitration between 5 cm and 15 cm water pressure with mask of choice and heated humidity. The patient will also inquire at his DME about travel friendly CPAPs.   Interpreting Physician:  Joshua Novas, MD  08-31-2019           Sleep Summary  Oxygen Saturation Statistics   Start Study Time: End Study Time: Total Recording Time:        10:16:50 PM 6:45:24 AM 8 h, 28 min  Total Sleep Time % REM of Sleep Time:  7 h, 17 min 22.2    Mean: 92 Minimum: 80 Maximum: 98  Mean of Desaturations Nadirs (%):   91  Oxygen Desaturation. %:   4-9 10-20 >20 Total  Events Number Total    54  2 96.4 3.6  0 0.0  56 100.0  Oxygen Saturation: <90 <=88 <85 <80 <70  Duration (minutes): Sleep % 0.6 0.1  0.1 0.0  0.0 0.0 0.0 0.0 0.0 0.0     Respiratory Indices      Total Events REM NREM All Night  pRDI:  184  pAHI:  175 ODI:  56  pAHIc:  11  % CSR: 0.0 31.7 31.1 7.4 0.7 25.9 24.4 8.5 1.9 27.2 25.8 8.3 1.6       Pulse Rate Statistics during Sleep (BPM)      Mean:  72 Minimum: 44 Maximum: 104    Indices are calculated using technically valid sleep time of  6 h, 46 min. pRDI/pAHI are calculated using oxi desaturations ? 3%  Body Position Statistics  Position Supine Prone Right Left Non-Supine  Sleep (min) 39.1 169.0 140.0 89.5 398.5  Sleep % 8.9 38.6 32.0 20.5 91.1  pRDI 38.6 29.9 27.9 16.2 26.1  pAHI 35.2 27.9 27.9 14.8 24.9  ODI 15.1 8.7 7.2 6.3 7.6     Snoring Statistics Snoring Level (dB) >40 >50 >60 >70 >80 >Threshold (45)  Sleep (min) 428.6 308.0 164.1 2.9 0.0 360.3  Sleep % 97.9 70.4 37.5 0.7 0.0 82.3    Mean: 56 dB Sleep Stages Chart

## 2019-09-01 NOTE — Telephone Encounter (Signed)
I called pt. I advised pt that Dr. Vickey Huger reviewed their sleep study results and found that pt had moderate to severe sleep apnea. Dr. Vickey Huger recommends that pt starts auto CPAP. I reviewed PAP compliance expectations with the pt. Pt is agreeable to starting a CPAP. I advised pt that an order will be sent to a DME, [facility], and [facility] will call the pt within about one week after they file with the pt's insurance. [facility] will show the pt how to use the machine, fit for masks, and troubleshoot the CPAP if needed. A follow up appt was made for insurance purposes with Dr. Vickey Huger on  April 5,2021 at 1:30 pm. Pt verbalized understanding to arrive 15 minutes early and bring their CPAP. A letter with all of this information in it will be mailed to the pt as a reminder. I verified with the pt that the address we have on file is correct. Pt verbalized understanding of results. Pt had no questions at this time but was encouraged to call back if questions arise. I have sent the order to [facility] and have received confirmation that they have received the order.

## 2019-09-06 ENCOUNTER — Ambulatory Visit: Payer: BLUE CROSS/BLUE SHIELD | Admitting: Cardiology

## 2019-09-15 DIAGNOSIS — G4733 Obstructive sleep apnea (adult) (pediatric): Secondary | ICD-10-CM | POA: Diagnosis not present

## 2019-09-19 ENCOUNTER — Ambulatory Visit: Payer: BC Managed Care – PPO | Attending: Internal Medicine

## 2019-09-19 DIAGNOSIS — Z23 Encounter for immunization: Secondary | ICD-10-CM | POA: Insufficient documentation

## 2019-09-19 NOTE — Progress Notes (Signed)
   Covid-19 Vaccination Clinic  Name:  Joshua Phillips    MRN: [id number] DOB: 1960/11/25  09/19/2019  Joshua Phillips was observed post Covid-19 immunization for 15 minutes without incident. He was provided with Vaccine Information Sheet and instruction to access the V-Safe system.   Joshua Phillips was instructed to call 911 with any severe reactions post vaccine: Marland Kitchen Difficulty breathing  . Swelling of face and throat  . A fast heartbeat  . A bad rash all over body  . Dizziness and weakness   Immunizations Administered    Name Date Dose VIS Date Route   Pfizer COVID-19 Vaccine 09/19/2019 12:57 PM 0.3 mL 06/25/2019 Intramuscular   Manufacturer: [employer], [employer]   Lot: [id number]   NDC: [address]-5

## 2019-09-25 ENCOUNTER — Other Ambulatory Visit: Payer: Self-pay | Admitting: Cardiology

## 2019-09-28 ENCOUNTER — Other Ambulatory Visit: Payer: Self-pay

## 2019-09-28 DIAGNOSIS — E1165 Type 2 diabetes mellitus with hyperglycemia: Secondary | ICD-10-CM

## 2019-09-28 MED ORDER — JARDIANCE 10 MG PO TABS: 10.0000 mg | ORAL_TABLET | Freq: Every day | ORAL | 3 refills | Status: DC

## 2019-09-28 MED ORDER — LISINOPRIL-HYDROCHLOROTHIAZIDE 20-12.5 MG PO TABS: ORAL_TABLET | ORAL | 1 refills | Status: DC

## 2019-10-14 ENCOUNTER — Other Ambulatory Visit: Payer: Self-pay

## 2019-10-16 DIAGNOSIS — G4733 Obstructive sleep apnea (adult) (pediatric): Secondary | ICD-10-CM | POA: Diagnosis not present

## 2019-10-18 ENCOUNTER — Ambulatory Visit: Payer: BC Managed Care – PPO | Admitting: Internal Medicine

## 2019-10-18 ENCOUNTER — Other Ambulatory Visit: Payer: Self-pay

## 2019-10-18 ENCOUNTER — Ambulatory Visit: Payer: BC Managed Care – PPO | Admitting: Neurology

## 2019-10-18 ENCOUNTER — Encounter: Payer: Self-pay | Admitting: Neurology

## 2019-10-18 ENCOUNTER — Encounter: Payer: Self-pay | Admitting: Internal Medicine

## 2019-10-18 VITALS — BP 110/70 | HR 66 | Ht 68.0 in | Wt 228.0 lb

## 2019-10-18 VITALS — BP 117/85 | HR 60 | Temp 97.0°F | Ht 68.0 in | Wt 226.0 lb

## 2019-10-18 DIAGNOSIS — G4733 Obstructive sleep apnea (adult) (pediatric): Secondary | ICD-10-CM

## 2019-10-18 DIAGNOSIS — E6609 Other obesity due to excess calories: Secondary | ICD-10-CM

## 2019-10-18 DIAGNOSIS — Z9989 Dependence on other enabling machines and devices: Secondary | ICD-10-CM | POA: Diagnosis not present

## 2019-10-18 DIAGNOSIS — E1159 Type 2 diabetes mellitus with other circulatory complications: Secondary | ICD-10-CM | POA: Diagnosis not present

## 2019-10-18 DIAGNOSIS — E1165 Type 2 diabetes mellitus with hyperglycemia: Secondary | ICD-10-CM

## 2019-10-18 DIAGNOSIS — Z6834 Body mass index (BMI) 34.0-34.9, adult: Secondary | ICD-10-CM

## 2019-10-18 LAB — POCT GLYCOSYLATED HEMOGLOBIN (HGB A1C): Hemoglobin A1C: 6.6 % — AB (ref 4.0–5.6)

## 2019-10-18 NOTE — Patient Instructions (Signed)
Please continue: - Metformin 500 mg daily with dinner - Jardiance 10 mg daily before breakfast  Please return in 3-4 months with your sugar log.

## 2019-10-18 NOTE — Progress Notes (Signed)
Patient ID: Joshua Phillips, male   DOB: June 15, 1961, 59 y.o.   MRN: [id number]   This visit occurred during the SARS-CoV-2 public health emergency.  Safety protocols were in place, including screening questions prior to the visit, additional usage of staff PPE, and extensive cleaning of exam room while observing appropriate contact time as indicated for disinfecting solutions.   HPI: Joshua Phillips is a 59 y.o.-year-old male, initially referred by his cardiologist, Dr. Jacinto Halim, returning for follow-up for DM2, dx in 06/2019, non-insulin-dependent, uncontrolled, with complications (CAD s/p stent 01/2010).  Last visit 3.5 months ago.  He did well with his diet after last visit and using the treadmill. However, he hurt his knee few weeks ago >> now only biking. He also relaxed his diet >> sugars are slightly higher.  Reviewed HbA1c levels: Lab Results  Component Value Date   HGBA1C 8.3 (H) 06/30/2019   HGBA1C (H) 11/03/2010    5.7 (NOTE)                                                                       According to the ADA Clinical Practice Recommendations for 2011, when HbA1c is used as a screening test:   >=6.5%   Diagnostic of Diabetes Mellitus           (if abnormal result  is confirmed)  5.7-6.4%   Increased risk of developing Diabetes Mellitus  References:Diagnosis and Classification of Diabetes Mellitus,Diabetes Care,2011,34(Suppl 1):S62-S69 and Standards of Medical Care in         Diabetes - 2011,Diabetes Care,2011,34  (Suppl 1):S11-S61.   Pt is on: - Metformin 500 mg 1x a day, with b'fast >> now with dinner - Jardiance 10 mg daily in am  At last visit he was only checking once a month. Now 1-2x a day. - am: n/c >> 113-125 - 2h after b'fast: n/c >> 117-198 - before lunch: n/c >> 122 - 2h after lunch: n/c >> 139-186 - before dinner: n/c >> 99-128 - 2h after dinner: n/c >> 142-182 - bedtime: n/c >> 96-166 - nighttime: n/c Lowest sugar was 74 >> 93; it is unclear at which level he has  hypoglycemia awareness Highest sugar was 180 >> 198.  Glucometer: Contour Next  Pt's meals are: - Breakfast: tea + millet bread or oatmeal or cereal or eggs (rarely) - Lunch: salad or sandwich - Dinner: veggies + flat breads, chicken/fish/beef; occas rice - Snacks: 1 alcoholic drink a day + diet soda; nuts; indian snack He drinks bitter mellon juice.  He feels that this is helping with his blood sugars.  -No CKD, last BUN/creatinine:  Lab Results  Component Value Date   BUN 11 07/23/2019   BUN 10 06/30/2019   CREATININE 1.06 07/23/2019   CREATININE 1.13 06/30/2019  On lisinopril 20.  -+ HL; last set of lipids: Lab Results  Component Value Date   CHOL 132 06/30/2019   HDL 27 (L) 06/30/2019   LDLCALC 84 06/30/2019   TRIG 112 06/30/2019   CHOLHDL 4.9 06/30/2019  On Crestor 5+ fish oil. On ASA 81.  - last eye exam was in 07/23/2019: No DR.  He has dry eyes.  -He denies numbness and tingling in his feet.  Pt has FH  of DM in father.  Mother had PMR.  He also has a history of HTN.  He had reflux at last visit but now resolved after started C-PAP machine 09/2019 (moderate OSA).  He had fluctuating calcium levels in the past: Lab Results  Component Value Date   CALCIUM 10.0 07/23/2019   CALCIUM 10.9 (H) 06/30/2019   CALCIUM 8.9 09/29/2016   CALCIUM 8.7 (L) 09/28/2016   CALCIUM 9.0 09/26/2016   CALCIUM 8.3 (L) 09/23/2016   CALCIUM 9.0 11/04/2010   CALCIUM 8.9 11/03/2010   CALCIUM 9.2 11/03/2010   CALCIUM 9.1 02/03/2010   No results found for: VD25OH   He does 2-3 week detox treatments in [place] 2x a year ([facility] [place] He loses weight and feels better after going to these retreats.  During the coronavirus pandemic, he could not exercise, was less active, and ate more >> started to feel poorly.  At that time, he was started on Jardiance by Dr. Jacinto Halim and referred to endocrinology.  ROS: Constitutional: no weight gain/no weight loss, + fatigue, no subjective  hyperthermia, no subjective hypothermia Eyes: no blurry vision, + xerophthalmia ENT: no sore throat, no nodules palpated in neck, no dysphagia, no odynophagia, no hoarseness Cardiovascular: no CP/no SOB/no palpitations/no leg swelling Respiratory: no cough/no SOB/no wheezing Gastrointestinal: no N/no V/no D/no C/no acid reflux Musculoskeletal: no muscle aches/+ joint aches - hurt knee Skin: no rashes, no hair loss Neurological: no tremors/no numbness/no tingling/no dizziness  I reviewed pt's medications, allergies, PMH, social hx, family hx, and changes were documented in the history of present illness. Otherwise, unchanged from my initial visit note.  Past Medical History:  Diagnosis Date  . Hypertension    Past Surgical History:  Procedure Laterality Date  . CARDIAC CATHETERIZATION    . CARDIAC SURGERY     stent placement   Social History   Socioeconomic History  . Marital status: Married    Spouse name: Not on file  . Number of children: 2  . Years of education: Not on file  . Highest education level: Not on file  Occupational History  . Not on file  Tobacco Use  . Smoking status: Former Smoker    Packs/day: 0.25    Years: 10.00    Pack years: 2.50    Quit date: 1997    Years since quitting: 24.2  . Smokeless tobacco: Never Used  Substance and Sexual Activity  . Alcohol use: Yes    Comment: occ  . Drug use: No  . Sexual activity: Yes  Other Topics Concern  . Not on file  Social History Narrative  . Not on file   Social Determinants of Health   Financial Resource Strain:   . Difficulty of Paying Living Expenses:   Food Insecurity:   . Worried About Programme researcher, broadcasting/film/video in the Last Year:   . Barista in the Last Year:   Transportation Needs:   . Freight forwarder (Medical):   Marland Kitchen Lack of Transportation (Non-Medical):   Physical Activity:   . Days of Exercise per Week:   . Minutes of Exercise per Session:   Stress:   . Feeling of Stress :    Social Connections:   . Frequency of Communication with Friends and Family:   . Frequency of Social Gatherings with Friends and Family:   . Attends Religious Services:   . Active Member of Clubs or Organizations:   . Attends Banker Meetings:   Marland Kitchen Marital Status:  Intimate Partner Violence:   . Fear of Current or Ex-Partner:   . Emotionally Abused:   Marland Kitchen Physically Abused:   . Sexually Abused:    Current Outpatient Medications on File Prior to Visit  Medication Sig Dispense Refill  . aspirin EC 81 MG tablet Take 81 mg by mouth daily.    . Cholecalciferol (VITAMIN D-3 PO) Take 1 capsule by mouth daily.    . Coenzyme Q10 (CO Q-10 PO) Take 1 capsule by mouth daily.    . empagliflozin (JARDIANCE) 10 MG TABS tablet Take 10 mg by mouth daily before breakfast. 90 tablet 3  . glucose blood (CONTOUR NEXT TEST) test strip Use as instructed to check blood sugar once a day 100 each 12  . lisinopril-hydrochlorothiazide (ZESTORETIC) 20-12.5 MG tablet TAKE 1 TABLET BY MOUTH ONCE DAILY IN THE MORNING 90 tablet 1  . metFORMIN (GLUCOPHAGE) 500 MG tablet Take 1 tablet (500 mg total) by mouth daily with supper. 90 tablet 3  . metoprolol succinate (TOPROL-XL) 25 MG 24 hr tablet Take 1 tablet (25 mg total) by mouth daily. 90 tablet 3  . Omega-3 Fatty Acids (FISH OIL PO) Take 1 capsule by mouth daily.    . rosuvastatin (CRESTOR) 10 MG tablet TAKE 1/2 TO 1 (ONE-HALF TO ONE) TABLET BY MOUTH AS DIRECTED (Patient taking differently: 1/2 tab qd) 90 tablet 0  . ticagrelor (BRILINTA) 60 MG TABS tablet Take 1 tablet (60 mg total) by mouth 2 (two) times daily. 180 tablet 2   No current facility-administered medications on file prior to visit.   No Known Allergies Family History  Problem Relation Age of Onset  . Heart attack Father   . Diabetes Father   . Hypertension Mother   . Stroke Mother     PE: BP 110/70   Pulse 66   Ht 5\' 8"  (1.727 m)   Wt 228 lb (103.4 kg)   SpO2 97%   BMI 34.67 kg/m   Wt Readings from Last 3 Encounters:  10/18/19 228 lb (103.4 kg)  07/27/19 229 lb (103.9 kg)  07/07/19 228 lb (103.4 kg)   Constitutional: overweight, in NAD Eyes: PERRLA, EOMI, no exophthalmos ENT: moist mucous membranes, no thyromegaly, no cervical lymphadenopathy Cardiovascular: RRR, No MRG Respiratory: CTA B Gastrointestinal: abdomen soft, NT, ND, BS+ Musculoskeletal: no deformities, strength intact in all 4 Skin: moist, warm, no rashes Neurological: no tremor with outstretched hands, DTR normal in all 4  ASSESSMENT: 1. DM2, non-insulin-dependent, uncontrolled, with complications - CAD, s/p stent 2011  PLAN:  1. Patient with longstanding, uncontrolled, type 2 diabetes, on low-dose Metformin and SGLT2 inhibitor.  He tolerates these well.  These were started before last visit by Dr. 2012 after his HbA1c returned elevated, at 8.3% that he was feeling poorly.  Visit he had increased fatigue that we suspected dehydration so we discussed about checking with Dr. Jacinto Halim if he could stop the HCTZ. -At last visit, he was not checking blood sugars and I strongly advised him to start.  We also discussed about improving his diet to reduce his insulin resistance.  I suggested a lower fat diet.  We also moved Metformin at dinnertime to help limit gluconeogenesis overnight.  We continued Jardiance in the morning. -At this visit, sugars are mostly at goal with few hyperglycemic spikes.  They are more frequent after he hurt his knee so he could not walk on the treadmill, but he is still trying to exercise by lifting weights and using his bike.  He  is exercising between 30 minutes and 1 hour/day.  He is also trying to adjust his diet and uses bitter melon juice.  This is known to lower blood sugars and he feels that this is helping him.  We will continue this.  We did discuss about possibly increasing the Metformin to 2 tablets a day and we also have room to increase Jardiance to help with the hyperglycemic  spikes, however, due to the improvement in his blood sugars, we decided for now to keep his doses the same, per his preference.   - I suggested to:  Patient Instructions  Please continue: - Metformin 500 mg daily with dinner - Jardiance 10 mg daily before breakfast  Please return in 3-4 months with your sugar log.  - we checked his HbA1c: 6.6% (much better) - advised to check sugars at different times of the day - 1x a day, rotating check times - advised for yearly eye exams >> he is UTD - return to clinic in 3 -81months  2. HL - Reviewed latest lipid panel from the end of last year: LDL above goal of less than 70, triglycerides at goal, HDL low Lab Results  Component Value Date   CHOL 132 06/30/2019   HDL 27 (L) 06/30/2019   LDLCALC 84 06/30/2019   TRIG 112 06/30/2019   CHOLHDL 4.9 06/30/2019  - Continues the statin and fish oil without side effects.  3.  Obesity class I -We will continue SGLT 2 inhibitor which should also help with weight loss -No weight loss since last visit  Philemon Kingdom, MD PhD [facility]

## 2019-10-18 NOTE — Progress Notes (Signed)
SLEEP MEDICINE CLINIC    Provider:  Melvyn Novas, MD  Primary Care Physician:     Referring Provider: Yates Decamp, MD , Cardiology        Chief Complaint according to patient   Patient presents with:    . New Patient (Initial Visit)     pt here for initial CPAP follow up. patient wonders if he need the machine. states that he still sleeps as much and feels as tired as before. advised that Dr Vickey Huger will review the test results and his download from the machine and explain that it is helping. [facility]      HISTORY OF PRESENT ILLNESS:  Joshua Phillips is a 59 y.o. year old [place] born male patient and seen upon referral by Dr Jacinto Halim on 10/18/2019.  Chief concern according to patient : " I have solar urticaria , restricting my outdoor time, especially golfing. Mr Ulyess Blossom is involved with [employer] and brought [employer] to sponsor the golf tournament.    I have the pleasure of seeing Joshua Phillips today, a right -handed Asian male with a possible sleep disorder. He  has a past medical history of Hypertension. Diabetes and treated with Metformin and Jardiane. Had been on Belviq. Has endorsed GERD, Solar urticaria and recent weight gain. He had an MI in 2011, and is followed by Dr Jacinto Halim for CAD since. 55% blockage in 2011 by angiogram. He had kidney stones that year, too. .  The patient had a first sleep study in the year 1990 as a volunteer - in a research study. No apnea at that time.  Sleep relevant medical history: Nocturia once at night . Family medical /sleep history: father with CAD.    Social history:  Patient is working as a Public librarian for [employer] and travels a lot- he has not had trouble with jet lag. He  lives in a household with spouse, 2 children are in college. Wife built a new house just 2 years ago.  The patient currently works: all the time, not regular.  Tobacco use; he quit almost 25 years ago.  ETOH use : 2 drinks a week-. Caffeine intake in form of  Coffee( none ) Soda( some with vodka) Tea ( hot tea , in AM ) or energy drinks. No dairy.  Regular exercise in form of golf.     Sleep habits are as follows: The patient's dinner time is between 6 PM. The patient goes to bed at 10-12 PM and continues to sleep for 4-6 hours, wakes for 1 bathroom break. He spends the evening watching Tv in the den. The preferred sleep position is laterally, with the support of 1 pillow.  Dreams are reportedly frequent, often contain work related issues. Marland Kitchen   6-7 AM is the usual rise time. The patient wakes up spontaneously before his alarm.  He reports he is feeling refreshed or restored in AM, has a morning exercise regimen- no morning headaches, or residual fatigue.  Naps were taken frequently, but this changed with diabetes treatments- now he has energy, naps were lasting from to 1-2 hours .      4-5-2021_ Revisit for Mr Magro, after sleep study: his reflux and snoring have improved, but energy level remains low.  He has been sleeping 8-10 hours daily.  He no longer needs naps, though.  The patient also reported that his hemoglobin A1c has improved. His BP has improved.  However he has failed to notice  any kind of energy search .  His sleep study was a home sleep test performed on August 24, 2019 and his overall apnea count was moderate to severe with an AHI of 25.8 and an accentuation in REM sleep to 31.2/h.  No hypoxemia was noted making this an uncomplicated apnea.  I recommended CPAP by auto titration between 5 and 15 cmH2O the patient has been a compliant user over 93% for the last 30 days and average daily use at time of 6 hours and 28 minutes.  Only 3 days was he unable to provide 4 hours or more of use.  The settings are as I had coded before 5 through 15 cmH2O with 3 cm expiratory pressure relief and his residual AHI is 0.3 which I would consider very good reduction.  However he has not noted an improvement overall and sadly it is already 6 weeks or longer  that he has used his machine so we should notice some kind of improvement.  His Epworth sleepiness score today was endorsed at 2 points.   Review of Systems: Out of a complete 14 system review, the patient complains of only the following symptoms, and all other reviewed systems are negative.:  Fatigue, sleepiness , snoring, fragmented sleep, weight gain.    How likely are you to doze in the following situations: 0 = not likely, 1 = slight chance, 2 = moderate chance, 3 = high chance   Sitting and Reading? Watching Television? Sitting inactive in a public place (theater or meeting)? As a passenger in a car for an hour without a break? Lying down in the afternoon when circumstances permit? Sitting and talking to someone? Sitting quietly after lunch without alcohol? In a car, while stopped for a few minutes in traffic?   Total = 3- not endorsing sleepiness, waking refreshed. / 24 points     Social History   Socioeconomic History  . Marital status: Married    Spouse name: Not on file  . Number of children: 2  . Years of education: Not on file  . Highest education level: Not on file  Occupational History  . Not on file  Tobacco Use  . Smoking status: Former Smoker    Packs/day: 0.25    Years: 10.00    Pack years: 2.50    Quit date: 1997    Years since quitting: 24.2  . Smokeless tobacco: Never Used  Substance and Sexual Activity  . Alcohol use: Yes    Comment: occ  . Drug use: No  . Sexual activity: Yes  Other Topics Concern  . Not on file  Social History Narrative  . Not on file   Social Determinants of Health   Financial Resource Strain:   . Difficulty of Paying Living Expenses:   Food Insecurity:   . Worried About Programme researcher, broadcasting/film/video in the Last Year:   . Barista in the Last Year:   Transportation Needs:   . Freight forwarder (Medical):   Marland Kitchen Lack of Transportation (Non-Medical):   Physical Activity:   . Days of Exercise per Week:   . Minutes of  Exercise per Session:   Stress:   . Feeling of Stress :   Social Connections:   . Frequency of Communication with Friends and Family:   . Frequency of Social Gatherings with Friends and Family:   . Attends Religious Services:   . Active Member of Clubs or Organizations:   . Attends Banker  Meetings:   Marland Kitchen Marital Status:     Family History  Problem Relation Age of Onset  . Heart attack Father   . Diabetes Father   . Hypertension Mother   . Stroke Mother     Past Medical History:  Diagnosis Date  . Hypertension     Past Surgical History:  Procedure Laterality Date  . CARDIAC CATHETERIZATION    . CARDIAC SURGERY     stent placement     Current Outpatient Medications on File Prior to Visit  Medication Sig Dispense Refill  . aspirin EC 81 MG tablet Take 81 mg by mouth daily.    . Cholecalciferol (VITAMIN D-3 PO) Take 1 capsule by mouth daily.    . Coenzyme Q10 (CO Q-10 PO) Take 1 capsule by mouth daily.    . empagliflozin (JARDIANCE) 10 MG TABS tablet Take 10 mg by mouth daily before breakfast. 90 tablet 3  . glucose blood (CONTOUR NEXT TEST) test strip Use as instructed to check blood sugar once a day 100 each 12  . lisinopril-hydrochlorothiazide (ZESTORETIC) 20-12.5 MG tablet TAKE 1 TABLET BY MOUTH ONCE DAILY IN THE MORNING 90 tablet 1  . metFORMIN (GLUCOPHAGE) 500 MG tablet Take 1 tablet (500 mg total) by mouth daily with supper. 90 tablet 3  . metoprolol succinate (TOPROL-XL) 25 MG 24 hr tablet Take 1 tablet (25 mg total) by mouth daily. 90 tablet 3  . Omega-3 Fatty Acids (FISH OIL PO) Take 1 capsule by mouth daily.    . rosuvastatin (CRESTOR) 10 MG tablet TAKE 1/2 TO 1 (ONE-HALF TO ONE) TABLET BY MOUTH AS DIRECTED (Patient taking differently: 1/2 tab qd) 90 tablet 0  . ticagrelor (BRILINTA) 60 MG TABS tablet Take 1 tablet (60 mg total) by mouth 2 (two) times daily. 180 tablet 2   No current facility-administered medications on file prior to visit.    No  Known Allergies  Physical exam:  Today's Vitals   10/18/19 1324  BP: 117/85  Pulse: 60  Temp: (!) 97 F (36.1 C)  Weight: 226 lb (102.5 kg)  Height: 5\' 8"  (1.727 m)   Body mass index is 34.36 kg/m.   Wt Readings from Last 3 Encounters:  10/18/19 226 lb (102.5 kg)  10/18/19 228 lb (103.4 kg)  07/27/19 229 lb (103.9 kg)     Ht Readings from Last 3 Encounters:  10/18/19 5\' 8"  (1.727 m)  10/18/19 5\' 8"  (1.727 m)  07/27/19 5\' 8"  (1.727 m)      General: The patient is awake, alert and appears not in acute distress. The patient is well groomed. Head: Normocephalic, atraumatic. Neck is supple. Mallampati 3,  neck circumference: 19 inches . Nasal airflow  patent.  Retrognathia is seen, high grade.  Dental status:  Cardiovascular:  Regular rate and cardiac rhythm by pulse,  without distended neck veins. Respiratory: Lungs are clear to auscultation.  Skin:  Without evidence of ankle edema, or rash. Trunk: The patient's posture is erect.   Neurologic exam : The patient is awake and alert, oriented to place and time.   Memory subjective described as intact.  Attention span & concentration ability appears normal.  Speech is fluent,  without  dysarthria, dysphonia or aphasia.  Mood and affect are appropriate.   Cranial nerves: no loss of smell or taste reported  Pupils are equal and briskly reactive to light.tal planes were intact and without nystagmus. No Diplopia. Visual fields by finger perimetry are intact. Hearing was intact to soft voice  and finger rubbing.   Facial sensation intact to fine touch.  Facial motor strength is symmetric and tongue and uvula move midline.  Neck ROM : rotation, tilt and flexion extension were normal for age and shoulder shrug was symmetrical.  Motor exam:  Symmetric bulk, tone and ROM.   Normal tone without cog wheeling, symmetric grip strength . Sensory:  Fine touch, pinprick and vibration were tested  and  normal.  Proprioception tested in the  upper extremities was normal.  Coordination: Rapid alternating movements in the fingers/hands were of normal speed.  The Finger-to-nose maneuver was intact without evidence of ataxia, dysmetria or tremor.  Gait and station: Patient  walked without assistive device.    Deep tendon reflexes: in the  upper and lower extremities are symmetric and intact.  Babinski response was deferred.        After spending a total time of 20 minutes face to face and additional time for physical and neurologic examination, review of laboratory studies,  personal review of imaging studies, reports and results of other testing and review of referral information / records as far as provided in visit, I have established the following assessments:  1) Mr. Schappert has been at high risk for OSA, he snores, gained weight, developed DM, and had CAD at a young age. Mrs. Bogusz has witnessed him to gag or gasp.  His HST reported an AHI of 23.5/h and was not associated with hypoxemia.  He is still asleep immediately when going to bed.  The daytime naps have been reduced, the HBA1c and BP have improved after CPAP therapy started.    My Plan is to proceed with:  1) continue CPAP use.  2) he declined Modafinil.  3) He will contact Dr. Einar Gip about OZEMPIC>   I would like to thank Dr. Einar Gip  for allowing me to meet with and to take care of this pleasant patient.   In short, Joshua Phillips is presenting with EDS.  I plan to follow up either personally or through our NP within 12 month.   CC: I will share my notes with  Dr Cruzita Lederer.  Electronically signed by: Larey Seat, MD 10/18/2019 1:38 PM  [facility] Board certified by The [employer] of Sleep Medicine and Diplomate of the [employer] of Sleep Medicine. Board certified In Neurology through the [place], Fellow of the [employer] of Neurology. Medical Director of [facility].

## 2019-10-18 NOTE — Addendum Note (Signed)
Addended by: Darliss Ridgel I on: 10/18/2019 10:54 AM   Modules accepted: Orders

## 2019-10-18 NOTE — Patient Instructions (Signed)
Semaglutide injection solution What is this medicine? SEMAGLUTIDE (Sem a GLOO tide) is used to improve blood sugar control in adults with type 2 diabetes. This medicine may be used with other diabetes medicines. This drug may also reduce the risk of heart attack or stroke if you have type 2 diabetes and risk factors for heart disease. This medicine may be used for other purposes; ask your health care provider or pharmacist if you have questions. COMMON BRAND NAME(S): OZEMPIC What should I tell my health care provider before I take this medicine? They need to know if you have any of these conditions:  endocrine tumors (MEN 2) or if someone in your family had these tumors  eye disease, vision problems  history of pancreatitis  kidney disease  stomach problems  thyroid cancer or if someone in your family had thyroid cancer  an unusual or allergic reaction to semaglutide, other medicines, foods, dyes, or preservatives  pregnant or trying to get pregnant  breast-feeding How should I use this medicine? This medicine is for injection under the skin of your upper leg (thigh), stomach area, or upper arm. It is given once every week (every 7 days). You will be taught how to prepare and give this medicine. Use exactly as directed. Take your medicine at regular intervals. Do not take it more often than directed. If you use this medicine with insulin, you should inject this medicine and the insulin separately. Do not mix them together. Do not give the injections right next to each other. Change (rotate) injection sites with each injection. It is important that you put your used needles and syringes in a special sharps container. Do not put them in a trash can. If you do not have a sharps container, call your pharmacist or healthcare provider to get one. A special MedGuide will be given to you by the pharmacist with each prescription and refill. Be sure to read this information carefully each  time. This drug comes with INSTRUCTIONS FOR USE. Ask your pharmacist for directions on how to use this drug. Read the information carefully. Talk to your pharmacist or health care provider if you have questions. Talk to your pediatrician regarding the use of this medicine in children. Special care may be needed. Overdosage: If you think you have taken too much of this medicine contact a poison control center or emergency room at once. NOTE: This medicine is only for you. Do not share this medicine with others. What if I miss a dose? If you miss a dose, take it as soon as you can within 5 days after the missed dose. Then take your next dose at your regular weekly time. If it has been longer than 5 days after the missed dose, do not take the missed dose. Take the next dose at your regular time. Do not take double or extra doses. If you have questions about a missed dose, contact your health care provider for advice. What may interact with this medicine?  other medicines for diabetes Many medications may cause changes in blood sugar, these include:  alcohol containing beverages  antiviral medicines for HIV or AIDS  aspirin and aspirin-like drugs  certain medicines for blood pressure, heart disease, irregular heart beat  chromium  diuretics  male hormones, such as estrogens or progestins, birth control pills  fenofibrate  gemfibrozil  isoniazid  lanreotide  male hormones or anabolic steroids  MAOIs like Carbex, Eldepryl, Marplan, Nardil, and Parnate  medicines for weight loss  medicines for   allergies, asthma, cold, or cough  medicines for depression, anxiety, or psychotic disturbances  niacin  nicotine  NSAIDs, medicines for pain and inflammation, like ibuprofen or naproxen  octreotide  pasireotide  pentamidine  phenytoin  probenecid  quinolone antibiotics such as ciprofloxacin, levofloxacin, ofloxacin  some herbal dietary supplements  steroid medicines  such as prednisone or cortisone  sulfamethoxazole; trimethoprim  thyroid hormones Some medications can hide the warning symptoms of low blood sugar (hypoglycemia). You may need to monitor your blood sugar more closely if you are taking one of these medications. These include:  beta-blockers, often used for high blood pressure or heart problems (examples include atenolol, metoprolol, propranolol)  clonidine  guanethidine  reserpine This list may not describe all possible interactions. Give your health care provider a list of all the medicines, herbs, non-prescription drugs, or dietary supplements you use. Also tell them if you smoke, drink alcohol, or use illegal drugs. Some items may interact with your medicine. What should I watch for while using this medicine? Visit your doctor or health care professional for regular checks on your progress. Drink plenty of fluids while taking this medicine. Check with your doctor or health care professional if you get an attack of severe diarrhea, nausea, and vomiting. The loss of too much body fluid can make it dangerous for you to take this medicine. A test called the HbA1C (A1C) will be monitored. This is a simple blood test. It measures your blood sugar control over the last 2 to 3 months. You will receive this test every 3 to 6 months. Learn how to check your blood sugar. Learn the symptoms of low and high blood sugar and how to manage them. Always carry a quick-source of sugar with you in case you have symptoms of low blood sugar. Examples include hard sugar candy or glucose tablets. Make sure others know that you can choke if you eat or drink when you develop serious symptoms of low blood sugar, such as seizures or unconsciousness. They must get medical help at once. Tell your doctor or health care professional if you have high blood sugar. You might need to change the dose of your medicine. If you are sick or exercising more than usual, you might need  to change the dose of your medicine. Do not skip meals. Ask your doctor or health care professional if you should avoid alcohol. Many nonprescription cough and cold products contain sugar or alcohol. These can affect blood sugar. Pens should never be shared. Even if the needle is changed, sharing may result in passing of viruses like hepatitis or HIV. Wear a medical ID bracelet or chain, and carry a card that describes your disease and details of your medicine and dosage times. Do not become pregnant while taking this medicine. Women should inform their doctor if they wish to become pregnant or think they might be pregnant. There is a potential for serious side effects to an unborn child. Talk to your health care professional or pharmacist for more information. What side effects may I notice from receiving this medicine? Side effects that you should report to your doctor or health care professional as soon as possible:  allergic reactions like skin rash, itching or hives, swelling of the face, lips, or tongue  breathing problems  changes in vision  diarrhea that continues or is severe  lump or swelling on the neck  severe nausea  signs and symptoms of infection like fever or chills; cough; sore throat; pain or trouble   passing urine  signs and symptoms of low blood sugar such as feeling anxious, confusion, dizziness, increased hunger, unusually weak or tired, sweating, shakiness, cold, irritable, headache, blurred vision, fast heartbeat, loss of consciousness  signs and symptoms of kidney injury like trouble passing urine or change in the amount of urine  trouble swallowing  unusual stomach upset or pain  vomiting Side effects that usually do not require medical attention (report to your doctor or health care professional if they continue or are bothersome):  constipation  diarrhea  nausea  pain, redness, or irritation at site where injected  stomach upset This list may not  describe all possible side effects. Call your doctor for medical advice about side effects. You may report side effects to FDA at 1-800-FDA-1088. Where should I keep my medicine? Keep out of the reach of children. Store unopened pens in a refrigerator between 2 and 8 degrees C (36 and 46 degrees F). Do not freeze. Protect from light and heat. After you first use the pen, it can be stored for 56 days at room temperature between 15 and 30 degrees C (59 and 86 degrees F) or in a refrigerator. Throw away your used pen after 56 days or after the expiration date, whichever comes first. Do not store your pen with the needle attached. If the needle is left on, medicine may leak from the pen. NOTE: This sheet is a summary. It may not cover all possible information. If you have questions about this medicine, talk to your doctor, pharmacist, or health care provider.  2020 Elsevier/Gold Standard (2019-03-16 09:41:51)  

## 2019-10-19 ENCOUNTER — Ambulatory Visit: Payer: BC Managed Care – PPO | Attending: Internal Medicine

## 2019-10-19 DIAGNOSIS — Z23 Encounter for immunization: Secondary | ICD-10-CM

## 2019-10-19 NOTE — Progress Notes (Signed)
   Covid-19 Vaccination [facility]  Name:  Joshua Phillips    MRN: [id number] DOB: 04/03/1961  10/19/2019  [name] was observed post Covid-19 immunization for 15 minutes without incident. He was provided with Vaccine Information Sheet and instruction to access the V-Safe system.   [name] was instructed to call 911 with any severe reactions post vaccine: Marland Kitchen Difficulty breathing  . Swelling of face and throat  . A fast heartbeat  . A bad rash all over body  . Dizziness and weakness   Immunizations Administered    Name Date Dose VIS Date Route   Pfizer COVID-19 Vaccine 10/19/2019 12:04 PM 0.3 mL 06/25/2019 Intramuscular   Manufacturer: [employer], [employer]   Lot: [id number]   NDC: [address]-1

## 2019-11-05 NOTE — Progress Notes (Signed)
Primary Physician/Referring:  Deatra James, MD  Patient ID: Joshua Phillips, male    DOB: May 21, 1961, 59 y.o.   MRN: [id number]  Chief Complaint  Patient presents with  . Hypertension  . Hyperlipidemia  . Follow-up    2 month   HPI:    Joshua Phillips  is a 59 y.o. Asian [place] male with CAD S/P stenting to his distal right coronary artery in July 2011 for exertional angina pectoris with resolution of symptoms, hypertension, hyperlipidemia, newly diagnosed diabetes mellitus and moderate to severe obstructive sleep apnea diagnosed in February 2021 and has been compliant with CPAP presents for 57-month follow-up visit.  He has noticed marked improvement in daytime somnolence, diabetes is also now improved and controlled but still requires 7 to 8 hours of sleep but he is not sleeping during the daytime.  Denies chest pain or dyspnea.  Past Medical History:  Diagnosis Date  . Hypertension    Past Surgical History:  Procedure Laterality Date  . CARDIAC CATHETERIZATION    . CARDIAC SURGERY     stent placement   Family History  Problem Relation Age of Onset  . Heart attack Father   . Diabetes Father   . Hypertension Mother   . Stroke Mother     Social History   Tobacco Use  . Smoking status: Former Smoker    Packs/day: 0.25    Years: 10.00    Pack years: 2.50    Quit date: 1997    Years since quitting: 24.3  . Smokeless tobacco: Never Used  Substance Use Topics  . Alcohol use: Yes    Comment: occ   Marital Status: Married  ROS  Review of Systems  Cardiovascular: Negative for chest pain, dyspnea on exertion and leg swelling.  Respiratory: Positive for snoring (OSA on CPAP).   Gastrointestinal: Negative for melena.   Objective  Blood pressure 128/87, pulse 71, temperature (!) 96.3 F (35.7 C), temperature source Temporal, resp. rate 17, height 5\' 8"  (1.727 m), weight 226 lb (102.5 kg), SpO2 98 %.  Vitals with BMI 11/08/2019 10/18/2019 10/18/2019  Height 5\' 8"  5\' 8"  5\' 8"    Weight 226 lbs 226 lbs 228 lbs  BMI 34.37 34.37 34.68  Systolic 128 117 12/18/2019  Diastolic 87 85 70  Pulse 71 60 66     Physical Exam  Constitutional:  He is well-built and mild to moderately obese in no acute distress.   Cardiovascular: Normal rate, regular rhythm, normal heart sounds and intact distal pulses.  No JVD. No leg edema.  Pulmonary/Chest: Effort normal and breath sounds normal. No accessory muscle usage. No respiratory distress.  Abdominal: Soft. Bowel sounds are normal.  Musculoskeletal:     Cervical back: Normal range of motion.   Laboratory examination:   Recent Labs    06/30/19 1101 07/23/19 1155  NA 139 142  K 4.4 4.4  CL 103 103  CO2 22 22  GLUCOSE 176* 180*  BUN 10 11  CREATININE 1.13 1.06  CALCIUM 10.9* 10.0  GFRNONAA 72 78  GFRAA 84 90   CrCl cannot be calculated (Patient's most recent lab result is older than the maximum 21 days allowed.).  CMP Latest Ref Rng & Units 07/23/2019 06/30/2019 09/29/2016  Glucose 65 - 99 mg/dL 09/20/19) 09/20/2019) 07/02/2019)  BUN 6 - 24 mg/dL 11 10 6   Creatinine 0.76 - 1.27 mg/dL 10/01/2016 174(B 449(Q  Sodium 134 - 144 mmol/L 142 139 140  Potassium 3.5 - 5.2 mmol/L 4.4  4.4 3.8  Chloride 96 - 106 mmol/L 103 103 110  CO2 20 - 29 mmol/L 22 22 23   Calcium 8.7 - 10.2 mg/dL 10.0 10.9(H) 8.9  Total Protein 6.0 - 8.5 g/dL - 7.4 -  Total Bilirubin 0.0 - 1.2 mg/dL - 0.6 -  Alkaline Phos 39 - 117 IU/L - 81 -  AST 0 - 40 IU/L - 44(H) -  ALT 0 - 44 IU/L - 52(H) -   CBC Latest Ref Rng & Units 06/30/2019 09/29/2016 09/28/2016  WBC 3.4 - 10.8 x10E3/uL 7.2 5.8 4.6  Hemoglobin 13.0 - 17.7 g/dL 15.0 14.1 14.3  Hematocrit 37.5 - 51.0 % 43.6 40.6 41.4  Platelets 150 - 450 x10E3/uL 252 234 214   Lipid Panel     Component Value Date/Time   CHOL 132 06/30/2019 1059   TRIG 112 06/30/2019 1059   HDL 27 (L) 06/30/2019 1059   CHOLHDL 4.9 06/30/2019 1059   CHOLHDL 4.4 11/04/2010 0330   VLDL 19 11/04/2010 0330   LDLCALC 84 06/30/2019 1059   HEMOGLOBIN  A1C Lab Results  Component Value Date   HGBA1C 6.6 (A) 10/18/2019   MPG 117 (H) 11/03/2010   TSH Recent Labs    06/30/19 1059  TSH 1.640    Medications and allergies  No Known Allergies   Current Outpatient Medications  Medication Instructions  . aspirin EC 81 mg, Oral, Daily  . Cholecalciferol (VITAMIN D-3 PO) 1 capsule, Oral, Daily  . Coenzyme Q10 (CO Q-10 PO) 1 capsule, Oral, Daily  . glucose blood (CONTOUR NEXT TEST) test strip Use as instructed to check blood sugar once a day  . Jardiance 10 mg, Oral, Daily before breakfast  . lisinopril-hydrochlorothiazide (ZESTORETIC) 20-12.5 MG tablet TAKE 1 TABLET BY MOUTH ONCE DAILY IN THE MORNING  . metFORMIN (GLUCOPHAGE) 500 mg, Oral, Daily with supper  . metoprolol succinate (TOPROL-XL) 25 mg, Oral, Daily  . Omega-3 Fatty Acids (FISH OIL PO) 1 capsule, Oral, Daily  . rosuvastatin (CRESTOR) 10 mg, Oral  . ticagrelor (BRILINTA) 60 mg, Oral, 2 times daily   Radiology:   CT Angio Chest with Contrast 09/27/2016: Areas of coronary artery calcification.  Cardiac Studies:   Treadmill stress test. 03/29/2013 Treadmill exercise stress test 03/29/2013: Bruce protocol, patient exercised for 9 minutes and 15 seconds, achieved 10.5 METS, did have chest discomfort associated with 1.5-2 mm inferior and lateral ST segment depression which resolved at greater than 2 minutes into recovery  PTCA and stent to distal RCA 02/02/2010 2.5x18 mm Promus DES. Coronary angio 11/14/2010: Mild diffuse CAD of all three vessels. Stent patent. Ramus intermediate small with mid 90% stenosis.  Home Sleep Study 09/02/2019: Moderate -severe obstructive sleep apnea at AHI of 25.8/h with accentuation in REM sleep to 31.2/h. There is a trend to bradycardia noted, no hypoxemia events were correlated. CPAP recommended.   EKG   07/02/2019: Normal sinus rhythm at the rate of 68 bpm, normal axis, no evidence of ischemia, normal EKG.   Assessment     ICD-10-CM   1.  Coronary artery disease involving native coronary artery of native heart without angina pectoris  I25.10   2. Primary hypertension  I10   3. OSA on CPAP 08/24/2019: Mod to severe OSA  G47.33    Z99.89   4. Hyperlipidemia LDL goal <70  E78.5 rosuvastatin (CRESTOR) 10 MG tablet    Lipid Panel With LDL/HDL Ratio    Lipid Panel With LDL/HDL Ratio    No orders of the defined types were  placed in this encounter.   Medications Discontinued During This Encounter  Medication Reason  . rosuvastatin (CRESTOR) 10 MG tablet Reorder    Recommendations:   PROSPER PAFF  is a 59 y.o. Asian [place] male with CAD S/P stenting to his distal right coronary artery in July 2011 for exertional angina pectoris with resolution of symptoms, hypertension, hyperlipidemia, newly diagnosed diabetes mellitus and moderate to severe obstructive sleep apnea diagnosed in February 2021 and has been compliant with CPAP presents for 42-month follow-up visit.   Lipids are not at goal, will have LDL to less than 70, will increase Crestor from 5 mg to 10 mg, will repeat lipid profile testing in 2 months.  His blood pressure is now well controlled on lisinopril HCT which is changed from plain lisinopril and he is also on low-dose beta-blocker, continue the same.  Weight loss continues to be a major issue for the patient, he is wondering whether we could change his diabetic medications which would help in weight loss as well.  Marcelline Deist may be a good choice, in view of underlying coronary disease.  However would leave diabetes management to his endocrinologist Dr. Carlus Pavlov.  I will see him back on annual basis unless he has recurrence of angina pectoris.  Positive reinforcement given.  Yates Decamp, MD, [facility] 11/08/2019, 1:08 PM [facility] Office: [phone]   Carlus Pavlov, MD (Endocrines)

## 2019-11-08 ENCOUNTER — Encounter: Payer: Self-pay | Admitting: Cardiology

## 2019-11-08 ENCOUNTER — Ambulatory Visit: Payer: BLUE CROSS/BLUE SHIELD | Admitting: Cardiology

## 2019-11-08 ENCOUNTER — Other Ambulatory Visit: Payer: Self-pay

## 2019-11-08 VITALS — BP 128/87 | HR 71 | Temp 96.3°F | Resp 17 | Ht 68.0 in | Wt 226.0 lb

## 2019-11-08 DIAGNOSIS — G4733 Obstructive sleep apnea (adult) (pediatric): Secondary | ICD-10-CM | POA: Diagnosis not present

## 2019-11-08 DIAGNOSIS — I2510 Atherosclerotic heart disease of native coronary artery without angina pectoris: Secondary | ICD-10-CM

## 2019-11-08 DIAGNOSIS — E785 Hyperlipidemia, unspecified: Secondary | ICD-10-CM

## 2019-11-08 DIAGNOSIS — I10 Essential (primary) hypertension: Secondary | ICD-10-CM

## 2019-11-08 DIAGNOSIS — Z9989 Dependence on other enabling machines and devices: Secondary | ICD-10-CM

## 2019-11-15 DIAGNOSIS — G4733 Obstructive sleep apnea (adult) (pediatric): Secondary | ICD-10-CM | POA: Diagnosis not present

## 2019-11-17 ENCOUNTER — Other Ambulatory Visit: Payer: Self-pay | Admitting: Cardiology

## 2019-11-17 DIAGNOSIS — E785 Hyperlipidemia, unspecified: Secondary | ICD-10-CM

## 2019-12-16 DIAGNOSIS — G4733 Obstructive sleep apnea (adult) (pediatric): Secondary | ICD-10-CM | POA: Diagnosis not present

## 2019-12-21 ENCOUNTER — Other Ambulatory Visit: Payer: Self-pay

## 2019-12-21 ENCOUNTER — Telehealth: Payer: Self-pay

## 2019-12-21 DIAGNOSIS — E785 Hyperlipidemia, unspecified: Secondary | ICD-10-CM

## 2019-12-21 MED ORDER — NITROGLYCERIN 0.4 MG SL SUBL: 0.4000 mg | SUBLINGUAL_TABLET | SUBLINGUAL | 3 refills | Status: DC | PRN

## 2019-12-21 MED ORDER — ROSUVASTATIN CALCIUM 10 MG PO TABS: 10.0000 mg | ORAL_TABLET | Freq: Every day | ORAL | 3 refills | Status: DC

## 2019-12-21 NOTE — Telephone Encounter (Signed)
Please Rx and use CAD diagnosis in the chart please

## 2019-12-21 NOTE — Telephone Encounter (Signed)
Done. Patient aware.

## 2019-12-21 NOTE — Telephone Encounter (Signed)
Patient called requesting a refill of Nitroglycerin I did not see it in his chart as ever prescribed by [facility]. I told him I would call the pharmacy to refill verbally and they stated that he has never filled that Rx there. Can you please send in Nitro to his pharmacy? Thank you.

## 2020-01-11 DIAGNOSIS — G4733 Obstructive sleep apnea (adult) (pediatric): Secondary | ICD-10-CM | POA: Diagnosis not present

## 2020-01-15 DIAGNOSIS — G4733 Obstructive sleep apnea (adult) (pediatric): Secondary | ICD-10-CM | POA: Diagnosis not present

## 2020-02-15 DIAGNOSIS — G4733 Obstructive sleep apnea (adult) (pediatric): Secondary | ICD-10-CM | POA: Diagnosis not present

## 2020-03-17 DIAGNOSIS — G4733 Obstructive sleep apnea (adult) (pediatric): Secondary | ICD-10-CM | POA: Diagnosis not present

## 2020-04-14 ENCOUNTER — Other Ambulatory Visit: Payer: Self-pay | Admitting: Cardiology

## 2020-04-16 DIAGNOSIS — G4733 Obstructive sleep apnea (adult) (pediatric): Secondary | ICD-10-CM | POA: Diagnosis not present

## 2020-05-12 DIAGNOSIS — Z20822 Contact with and (suspected) exposure to covid-19: Secondary | ICD-10-CM | POA: Diagnosis not present

## 2020-05-13 DIAGNOSIS — Z20822 Contact with and (suspected) exposure to covid-19: Secondary | ICD-10-CM | POA: Diagnosis not present

## 2020-05-14 DIAGNOSIS — Z20822 Contact with and (suspected) exposure to covid-19: Secondary | ICD-10-CM | POA: Diagnosis not present

## 2020-05-17 DIAGNOSIS — G4733 Obstructive sleep apnea (adult) (pediatric): Secondary | ICD-10-CM | POA: Diagnosis not present

## 2020-05-27 ENCOUNTER — Other Ambulatory Visit: Payer: Self-pay | Admitting: Cardiology

## 2020-06-06 DIAGNOSIS — Z20822 Contact with and (suspected) exposure to covid-19: Secondary | ICD-10-CM | POA: Diagnosis not present

## 2020-06-17 DIAGNOSIS — Z20822 Contact with and (suspected) exposure to covid-19: Secondary | ICD-10-CM | POA: Diagnosis not present

## 2020-07-07 DIAGNOSIS — Z20822 Contact with and (suspected) exposure to covid-19: Secondary | ICD-10-CM | POA: Diagnosis not present

## 2020-07-27 DIAGNOSIS — G4733 Obstructive sleep apnea (adult) (pediatric): Secondary | ICD-10-CM | POA: Diagnosis not present

## 2020-09-02 ENCOUNTER — Other Ambulatory Visit: Payer: Self-pay | Admitting: Cardiology

## 2020-09-13 DIAGNOSIS — I10 Essential (primary) hypertension: Secondary | ICD-10-CM | POA: Diagnosis not present

## 2020-09-13 DIAGNOSIS — E785 Hyperlipidemia, unspecified: Secondary | ICD-10-CM | POA: Diagnosis not present

## 2020-09-13 DIAGNOSIS — Z7984 Long term (current) use of oral hypoglycemic drugs: Secondary | ICD-10-CM | POA: Diagnosis not present

## 2020-09-13 DIAGNOSIS — Z125 Encounter for screening for malignant neoplasm of prostate: Secondary | ICD-10-CM | POA: Diagnosis not present

## 2020-09-13 DIAGNOSIS — F1010 Alcohol abuse, uncomplicated: Secondary | ICD-10-CM | POA: Diagnosis not present

## 2020-09-13 DIAGNOSIS — E1169 Type 2 diabetes mellitus with other specified complication: Secondary | ICD-10-CM | POA: Diagnosis not present

## 2020-09-29 ENCOUNTER — Telehealth: Payer: Self-pay | Admitting: Internal Medicine

## 2020-09-29 ENCOUNTER — Other Ambulatory Visit: Payer: Self-pay | Admitting: Cardiology

## 2020-09-29 DIAGNOSIS — E1165 Type 2 diabetes mellitus with hyperglycemia: Secondary | ICD-10-CM

## 2020-10-02 MED ORDER — EMPAGLIFLOZIN 10 MG PO TABS: 10.0000 mg | ORAL_TABLET | Freq: Every day | ORAL | 0 refills | Status: DC

## 2020-10-02 MED ORDER — METFORMIN HCL 500 MG PO TABS: 500.0000 mg | ORAL_TABLET | Freq: Every day | ORAL | 0 refills | Status: DC

## 2020-10-02 NOTE — Addendum Note (Signed)
Addended by: Kenyon Ana on: 10/02/2020 12:26 PM   Modules accepted: Orders

## 2020-10-02 NOTE — Telephone Encounter (Signed)
[facility] [address] [place], [place] - [address] Phone:  [phone]  Fax:  [phone]     Pt called to schedule an appt. He requests a 90 day prescription for his Metformin and Jardiance.

## 2020-10-02 NOTE — Telephone Encounter (Signed)
Rx sent to preferred pharmacy for 60 day supply. Pt advised via MyChart message.

## 2020-10-20 ENCOUNTER — Other Ambulatory Visit: Payer: Self-pay | Admitting: Cardiology

## 2020-11-06 ENCOUNTER — Other Ambulatory Visit: Payer: Self-pay

## 2020-11-06 ENCOUNTER — Ambulatory Visit: Payer: BC Managed Care – PPO | Admitting: Cardiology

## 2020-11-06 ENCOUNTER — Encounter: Payer: Self-pay | Admitting: Cardiology

## 2020-11-06 VITALS — BP 90/58 | HR 62 | Temp 98.4°F | Resp 16 | Ht 68.0 in | Wt 220.8 lb

## 2020-11-06 DIAGNOSIS — E785 Hyperlipidemia, unspecified: Secondary | ICD-10-CM | POA: Diagnosis not present

## 2020-11-06 DIAGNOSIS — E119 Type 2 diabetes mellitus without complications: Secondary | ICD-10-CM

## 2020-11-06 DIAGNOSIS — I2510 Atherosclerotic heart disease of native coronary artery without angina pectoris: Secondary | ICD-10-CM | POA: Diagnosis not present

## 2020-11-06 DIAGNOSIS — I10 Essential (primary) hypertension: Secondary | ICD-10-CM | POA: Diagnosis not present

## 2020-11-06 NOTE — Progress Notes (Signed)
Primary Physician/Referring:  Donald Prose, MD  Patient ID: Joshua Phillips, male    DOB: 1961/05/22, 60 y.o.   MRN: [id number]  Chief Complaint  Patient presents with  . Coronary Artery Disease  . Hyperlipidemia  . Follow-up    1 Year   HPI:    Joshua Phillips  is a 60 y.o. Asian [place] male with CAD S/P stenting to his distal right coronary artery in July 2011 for exertional angina pectoris with resolution of symptoms, hypertension, hyperlipidemia, newly diagnosed diabetes mellitus and moderate to severe obstructive sleep apnea diagnosed in February 2021 and has been compliant with CPAP presents for 12 month follow-up visit.  He is presently doing well and has been compliant with CPAP.  He has not had any recurrence of chest pain, continues to exercise at least 5 to 7 days a week.  Few months ago he completely quit drinking any forms of alcohol and since then his A1c has improved and LFTs have normalized.  Weight is remained stable.  Past Medical History:  Diagnosis Date  . Hypertension    Past Surgical History:  Procedure Laterality Date  . CARDIAC CATHETERIZATION    . CARDIAC SURGERY     stent placement   Family History  Problem Relation Age of Onset  . Heart attack Father   . Diabetes Father   . Hypertension Mother   . Stroke Mother     Social History   Tobacco Use  . Smoking status: Former Smoker    Packs/day: 0.25    Years: 10.00    Pack years: 2.50    Types: Cigarettes    Quit date: 1997    Years since quitting: 25.3  . Smokeless tobacco: Never Used  Substance Use Topics  . Alcohol use: Yes    Comment: occ   Marital Status: Married  ROS  Review of Systems  Cardiovascular: Negative for chest pain, dyspnea on exertion and leg swelling.  Respiratory: Positive for snoring (OSA on CPAP).   Gastrointestinal: Negative for melena.   Objective  Blood pressure (!) 90/58, pulse 62, temperature 98.4 F (36.9 C), temperature source Temporal, resp. rate 16, height  5' 8"  (1.727 m), weight 220 lb 12.8 oz (100.2 kg), SpO2 98 %.  Vitals with BMI 11/06/2020 11/08/2019 10/18/2019  Height 5' 8"  5' 8"  5' 8"   Weight 220 lbs 13 oz 226 lbs 226 lbs  BMI 33.58 62.26 33.35  Systolic 90 456 256  Diastolic 58 87 85  Pulse 62 71 60     Physical Exam Constitutional:      Comments: He is well-built and mild to moderately obese in no acute distress.   Cardiovascular:     Rate and Rhythm: Normal rate and regular rhythm.     Pulses: Intact distal pulses.     Heart sounds: Normal heart sounds.     Comments: No JVD. No leg edema. Pulmonary:     Effort: Pulmonary effort is normal. No accessory muscle usage or respiratory distress.     Breath sounds: Normal breath sounds.  Abdominal:     General: Bowel sounds are normal.     Palpations: Abdomen is soft.    Laboratory examination:   No results for input(s): NA, K, CL, CO2, GLUCOSE, BUN, CREATININE, CALCIUM, GFRNONAA, GFRAA in the last 8760 hours. CrCl cannot be calculated (Patient's most recent lab result is older than the maximum 21 days allowed.).  CMP Latest Ref Rng & Units 07/23/2019 06/30/2019 09/29/2016  Glucose 65 -  99 mg/dL 180(H) 176(H) 101(H)  BUN 6 - 24 mg/dL 11 10 6   Creatinine 0.76 - 1.27 mg/dL 1.06 1.13 0.84  Sodium 134 - 144 mmol/L 142 139 140  Potassium 3.5 - 5.2 mmol/L 4.4 4.4 3.8  Chloride 96 - 106 mmol/L 103 103 110  CO2 20 - 29 mmol/L 22 22 23   Calcium 8.7 - 10.2 mg/dL 10.0 10.9(H) 8.9  Total Protein 6.0 - 8.5 g/dL - 7.4 -  Total Bilirubin 0.0 - 1.2 mg/dL - 0.6 -  Alkaline Phos 39 - 117 IU/L - 81 -  AST 0 - 40 IU/L - 44(H) -  ALT 0 - 44 IU/L - 52(H) -   CBC Latest Ref Rng & Units 06/30/2019 09/29/2016 09/28/2016  WBC 3.4 - 10.8 x10E3/uL 7.2 5.8 4.6  Hemoglobin 13.0 - 17.7 g/dL 15.0 14.1 14.3  Hematocrit 37.5 - 51.0 % 43.6 40.6 41.4  Platelets 150 - 450 x10E3/uL 252 234 214   Lipid Panel     Component Value Date/Time   CHOL 132 06/30/2019 1059   TRIG 112 06/30/2019 1059   HDL 27 (L)  06/30/2019 1059   CHOLHDL 4.9 06/30/2019 1059   CHOLHDL 4.4 11/04/2010 0330   VLDL 19 11/04/2010 0330   LDLCALC 84 06/30/2019 1059   HEMOGLOBIN A1C Lab Results  Component Value Date   HGBA1C 6.6 (A) 10/18/2019   MPG 117 (H) 11/03/2010   TSH No results for input(s): TSH in the last 8760 hours.   External labs:     Labs 09/13/2020:  PSA normal at 0.33, creatinine  LFTs normal.  Serum glucose 120 mg, BUN 14, creatinine 1.05, EGFR 73 mL.  A1c 6.3%.  Glucose 120, triglycerides 199, HDL 29, LDL 58.  Non-HDL cholesterol 91.  Medications and allergies  No Known Allergies   Outpatient Medications Prior to Visit  Medication Sig Dispense Refill  . aspirin EC 81 MG tablet Take 81 mg by mouth daily.    Marland Kitchen BRILINTA 60 MG TABS tablet Take 1 tablet by mouth twice daily 180 tablet 0  . Cholecalciferol (VITAMIN D-3 PO) Take 1 capsule by mouth daily.    . Coenzyme Q10 (CO Q-10 PO) Take 1 capsule by mouth daily.    . empagliflozin (JARDIANCE) 10 MG TABS tablet Take 1 tablet (10 mg total) by mouth daily before breakfast. 60 tablet 0  . glucose blood (CONTOUR NEXT TEST) test strip Use as instructed to check blood sugar once a day 100 each 12  . lisinopril-hydrochlorothiazide (ZESTORETIC) 20-12.5 MG tablet TAKE 1 TABLET BY MOUTH ONCE DAILY IN THE MORNING 90 tablet 0  . metFORMIN (GLUCOPHAGE) 500 MG tablet Take 1 tablet (500 mg total) by mouth daily with supper. 60 tablet 0  . metoprolol succinate (TOPROL-XL) 25 MG 24 hr tablet Take 1 tablet by mouth once daily 90 tablet 0  . nitroGLYCERIN (NITROSTAT) 0.4 MG SL tablet Place 1 tablet (0.4 mg total) under the tongue every 5 (five) minutes as needed for chest pain. 25 tablet 3  . Omega-3 Fatty Acids (FISH OIL PO) Take 1 capsule by mouth daily.    . rosuvastatin (CRESTOR) 10 MG tablet Take 1 tablet (10 mg total) by mouth daily. 90 tablet 3   No facility-administered medications prior to visit.    Radiology:   CT Angio Chest with Contrast  09/27/2016: Areas of coronary artery calcification.  Cardiac Studies:   Treadmill stress test. 03/29/2013 Treadmill exercise stress test 03/29/2013: Bruce protocol, patient exercised for 9 minutes and 15 seconds,  achieved 10.5 METS, did have chest discomfort associated with 1.5-2 mm inferior and lateral ST segment depression which resolved at greater than 2 minutes into recovery  PTCA and stent to distal RCA 02/02/2010 2.5x18 mm Promus DES. Coronary angio 11/14/2010: Mild diffuse CAD of all three vessels. Stent patent. Ramus intermediate small with mid 90% stenosis.  Home Sleep Study 09/02/2019: Moderate -severe obstructive sleep apnea at AHI of 25.8/h with accentuation in REM sleep to 31.2/h. There is a trend to bradycardia noted, no hypoxemia events were correlated. CPAP recommended.   EKG   EKG 11/06/2020: Normal sinus rhythm at rate of 65 bpm, normal axis.  No evidence of ischemia, normal EKG. No significant change from 07/02/2019   Assessment     ICD-10-CM   1. Coronary artery disease involving native coronary artery of native heart without angina pectoris  I25.10 EKG 12-Lead  2. Primary hypertension  I10   3. Hyperlipidemia LDL goal <70  E78.5   4. Type 2 diabetes mellitus without complication, without long-term current use of insulin (HCC)  E11.9     No orders of the defined types were placed in this encounter.   There are no discontinued medications.  Recommendations:   ALLIN FRIX  is a 60 y.o. Asian [place] male with CAD S/P stenting to his distal right coronary artery in July 2011 for exertional angina pectoris with resolution of symptoms, hypertension, hyperlipidemia, newly diagnosed diabetes mellitus and moderate to severe obstructive sleep apnea diagnosed in February 2021 and has been compliant with CPAP.   This is annual visit.  His main issue continues to be weight gain after he loses weight by going to naturopathy clinic in [place].  We discussed various ways in which he  could avoid calories.  He is also quit drinking alcohol and desserts after dinner and since then his A1c has improved.  Blood pressures well controlled in fact low but remains asymptomatic and so did not make any changes to his medications.  I reviewed his external labs.  Lipids are well controlled and he is tolerating statins without side effects.  With regard to coronary disease, he has not had any recurrence of angina, he likes to be on Brilinta, advised him that he should consider at least discontinuing aspirin to reduce risk of bleeding.  He is on low-dose Brilinta.  No changes in the medications were done today, I will see him back on annual basis.   Adrian Prows, MD, [facility] 11/06/2020, 11:27 AM Office: [phone] Pager: [phone]

## 2020-11-14 DIAGNOSIS — G4733 Obstructive sleep apnea (adult) (pediatric): Secondary | ICD-10-CM | POA: Diagnosis not present

## 2020-11-20 ENCOUNTER — Other Ambulatory Visit: Payer: Self-pay

## 2020-11-20 ENCOUNTER — Ambulatory Visit: Payer: BC Managed Care – PPO | Admitting: Internal Medicine

## 2020-11-20 ENCOUNTER — Encounter: Payer: Self-pay | Admitting: Internal Medicine

## 2020-11-20 VITALS — BP 128/82 | HR 66 | Ht 68.0 in | Wt 220.6 lb

## 2020-11-20 DIAGNOSIS — E6609 Other obesity due to excess calories: Secondary | ICD-10-CM

## 2020-11-20 DIAGNOSIS — Z6834 Body mass index (BMI) 34.0-34.9, adult: Secondary | ICD-10-CM | POA: Diagnosis not present

## 2020-11-20 DIAGNOSIS — E1165 Type 2 diabetes mellitus with hyperglycemia: Secondary | ICD-10-CM

## 2020-11-20 LAB — POCT GLYCOSYLATED HEMOGLOBIN (HGB A1C): Hemoglobin A1C: 6.1 % — AB (ref 4.0–5.6)

## 2020-11-20 MED ORDER — METFORMIN HCL 500 MG PO TABS: 500.0000 mg | ORAL_TABLET | Freq: Every day | ORAL | 3 refills | Status: DC

## 2020-11-20 MED ORDER — EMPAGLIFLOZIN 10 MG PO TABS: 10.0000 mg | ORAL_TABLET | Freq: Every day | ORAL | 3 refills | Status: DC

## 2020-11-20 NOTE — Progress Notes (Signed)
Patient ID: Joshua Phillips, male   DOB: 09/08/1960, 60 y.o.   MRN: [id number]   This visit occurred during the SARS-CoV-2 public health emergency.  Safety protocols were in place, including screening questions prior to the visit, additional usage of staff PPE, and extensive cleaning of exam room while observing appropriate contact time as indicated for disinfecting solutions.   HPI: Joshua Phillips is a 59 y.o.-year-old male, initially referred by his cardiologist, Dr. Jacinto Halim, returning for follow-up for DM2, dx in 06/2019, non-insulin-dependent, uncontrolled, with complications (CAD s/p stent 01/2010).  Last visit 1 year ago.  He returns for a visit as he could not refill his prescriptions without being seen.  Interim history: He was in [place] in 05/2021 at another health retreat >> he did not use sugar and no alcohol after he returned. Also, exercises consistently >> sugars are better.  Reviewed HbA1c levels: 09/13/2020: HbA1c 6.3%  Lab Results  Component Value Date   HGBA1C 6.6 (A) 10/18/2019   HGBA1C 8.3 (H) 06/30/2019   HGBA1C (H) 11/03/2010    5.7 (NOTE)                                                                       According to the ADA Clinical Practice Recommendations for 2011, when HbA1c is used as a screening test:   >=6.5%   Diagnostic of Diabetes Mellitus           (if abnormal result  is confirmed)  5.7-6.4%   Increased risk of developing Diabetes Mellitus  References:Diagnosis and Classification of Diabetes Mellitus,Diabetes Care,2011,34(Suppl 1):S62-S69 and Standards of Medical Care in         Diabetes - 2011,Diabetes Care,2011,34  (Suppl 1):S11-S61.   Pt is on: - Metformin 500 mg 1x a day, with b'fast >> with dinner - Jardiance 10 mg daily in am  Is checking sugars 1-2 times a day: - am: n/c >> 113-125 >> 90-114 - 2h after b'fast: n/c >> 117-198 >> n/c - before lunch: n/c >> 122 >> n/c - 2h after lunch: n/c >> 139-186 >> n/c - before dinner: n/c >> 99-128 >> n/c - 2h  after dinner: n/c >> 142-182 >> up to 145 - bedtime: n/c >> 96-166 >> n/c - nighttime: n/c Lowest sugar was 74 >> 93 >> 90; it is unclear at which level he has hypoglycemia awareness Highest sugar was 180 >> 198 >> 145.  Glucometer: Contour Next  Pt's meals are: - Breakfast: tea + millet bread or oatmeal or cereal or eggs (rarely) - Lunch: salad or sandwich - Dinner: veggies + flat breads, chicken/fish/beef; occas rice - Snacks:  + diet soda; nuts; indian snack He drinks bitter mellon juice.  He feels that this is helping with his blood sugars.  -No CKD, last BUN/creatinine:  09/13/2020: 48/1.05, GFR 73, glucose 120 Lab Results  Component Value Date   BUN 11 07/23/2019   BUN 10 06/30/2019   CREATININE 1.06 07/23/2019   CREATININE 1.13 06/30/2019  On lisinopril 20.  -+ HL; last set of lipids: 09/13/2020: 120/199/29/68 Lab Results  Component Value Date   CHOL 132 06/30/2019   HDL 27 (L) 06/30/2019   LDLCALC 84 06/30/2019   TRIG 112 06/30/2019   CHOLHDL 4.9  06/30/2019  On Crestor 5 >> 10 + fish oil 1 g once a day. On ASA 81.  - last eye exam was in 07/23/2019: No DR.  He has dry eyes.  -He denies numbness and tingling in his feet.  Pt has FH of DM in father.  Mother had PMR.  He also has a history of HTN. He has a history of transaminitis but his LFTs normalized 09/2020. He had GERD but this resolved after started C-PAP machine 09/2019 (moderate OSA). He had fluctuating calcium levels in the past: Lab Results  Component Value Date   CALCIUM 10.0 07/23/2019   CALCIUM 10.9 (H) 06/30/2019   CALCIUM 8.9 09/29/2016   CALCIUM 8.7 (L) 09/28/2016   CALCIUM 9.0 09/26/2016   CALCIUM 8.3 (L) 09/23/2016   CALCIUM 9.0 11/04/2010   CALCIUM 8.9 11/03/2010   CALCIUM 9.2 11/03/2010   CALCIUM 9.1 02/03/2010   No results found for: VD25OH   He does 2-3 week detox treatments in [place] 1-2x a year [place] [place] He loses weight and feels better after going to these  retreats.  During the coronavirus pandemic, he could not exercise, was less active, and ate more >> started to feel poorly.  At that time, he was started on Jardiance by Dr. Jacinto Halim and referred to endocrinology.  ROS: Constitutional: no weight gain/+ weight loss, no fatigue, no subjective hyperthermia, no subjective hypothermia Eyes: no blurry vision, + xerophthalmia ENT: no sore throat, no nodules palpated in neck, no dysphagia, no odynophagia, no hoarseness Cardiovascular: no CP/no SOB/no palpitations/no leg swelling Respiratory: no cough/no SOB/no wheezing Gastrointestinal: no N/no V/no D/no C/no acid reflux Musculoskeletal: no muscle aches/no joint aches  Skin: no rashes, no hair loss Neurological: no tremors/no numbness/no tingling/no dizziness  I reviewed pt's medications, allergies, PMH, social hx, family hx, and changes were documented in the history of present illness. Otherwise, unchanged from my initial visit note.  Past Medical History:  Diagnosis Date  . Hypertension    Past Surgical History:  Procedure Laterality Date  . CARDIAC CATHETERIZATION    . CARDIAC SURGERY     stent placement   Social History   Socioeconomic History  . Marital status: Married    Spouse name: Not on file  . Number of children: 2  . Years of education: Not on file  . Highest education level: Not on file  Occupational History  . Not on file  Tobacco Use  . Smoking status: Former Smoker    Packs/day: 0.25    Years: 10.00    Pack years: 2.50    Types: Cigarettes    Quit date: 1997    Years since quitting: 25.3  . Smokeless tobacco: Never Used  Vaping Use  . Vaping Use: Never used  Substance and Sexual Activity  . Alcohol use: Yes    Comment: occ  . Drug use: No  . Sexual activity: Yes  Other Topics Concern  . Not on file  Social History Narrative  . Not on file   Social Determinants of Health   Financial Resource Strain: Not on file  Food Insecurity: Not on file   Transportation Needs: Not on file  Physical Activity: Not on file  Stress: Not on file  Social Connections: Not on file  Intimate Partner Violence: Not on file   Current Outpatient Medications on File Prior to Visit  Medication Sig Dispense Refill  . aspirin EC 81 MG tablet Take 81 mg by mouth daily.    Marland Kitchen BRILINTA 60  MG TABS tablet Take 1 tablet by mouth twice daily 180 tablet 0  . Cholecalciferol (VITAMIN D-3 PO) Take 1 capsule by mouth daily.    . Coenzyme Q10 (CO Q-10 PO) Take 1 capsule by mouth daily.    . empagliflozin (JARDIANCE) 10 MG TABS tablet Take 1 tablet (10 mg total) by mouth daily before breakfast. 60 tablet 0  . glucose blood (CONTOUR NEXT TEST) test strip Use as instructed to check blood sugar once a day 100 each 12  . lisinopril-hydrochlorothiazide (ZESTORETIC) 20-12.5 MG tablet TAKE 1 TABLET BY MOUTH ONCE DAILY IN THE MORNING 90 tablet 0  . metFORMIN (GLUCOPHAGE) 500 MG tablet Take 1 tablet (500 mg total) by mouth daily with supper. 60 tablet 0  . metoprolol succinate (TOPROL-XL) 25 MG 24 hr tablet Take 1 tablet by mouth once daily 90 tablet 0  . nitroGLYCERIN (NITROSTAT) 0.4 MG SL tablet Place 1 tablet (0.4 mg total) under the tongue every 5 (five) minutes as needed for chest pain. 25 tablet 3  . Omega-3 Fatty Acids (FISH OIL PO) Take 1 capsule by mouth daily.    . rosuvastatin (CRESTOR) 10 MG tablet Take 1 tablet (10 mg total) by mouth daily. 90 tablet 3   No current facility-administered medications on file prior to visit.   No Known Allergies Family History  Problem Relation Age of Onset  . Heart attack Father   . Diabetes Father   . Hypertension Mother   . Stroke Mother     PE: BP 128/82 (BP Location: Right Arm, Patient Position: Sitting, Cuff Size: Normal)   Pulse 66   Ht 5\' 8"  (1.727 m)   Wt 220 lb 9.6 oz (100.1 kg)   SpO2 96%   BMI 33.54 kg/m  Wt Readings from Last 3 Encounters:  11/20/20 220 lb 9.6 oz (100.1 kg)  11/06/20 220 lb 12.8 oz (100.2  kg)  11/08/19 226 lb (102.5 kg)   Constitutional: overweight, in NAD Eyes: PERRLA, EOMI, no exophthalmos ENT: moist mucous membranes, no thyromegaly, no cervical lymphadenopathy Cardiovascular: RRR, No MRG Respiratory: CTA B Gastrointestinal: abdomen soft, NT, ND, BS+ Musculoskeletal: no deformities, strength intact in all 4 Skin: moist, warm, no rashes Neurological: no tremor with outstretched hands, DTR normal in all 4  ASSESSMENT: 1. DM2, non-insulin-dependent, uncontrolled, with complications - CAD, s/p stent 2011 - Dr. Jacinto HalimGanji  PLAN:  1. Patient with longstanding, uncontrolled, type 2 diabetes, on low-dose metformin and also SGLT2 inhibitor, returning after long absence of a year.  At last visit, HbA1c was excellent, at 6.6%.  At that time, we discussed about possibly increasing the metformin dose, but we did not start this due to the improvement in his blood sugars.  At that time, she was also exercising consistently and was drinking better melon juice to improve his blood sugars.   -At this visit, he returns after having had labs on 09/13/2020.  At that time, HbA1c was excellent, at 6.3%.  He describes that after he returned from the health retreat in [place], he stopped any alcohol (he was drinking 2-3 drinks every night) and also stopped eating sugar and his sugars improved.  He is also compliant with the CPAP machine which should also help. he is not checking sugars consistently, only 1-2x a week and I advised him to advised to check a little more consistently.  However, whenever he checks his blood sugars, they are at goal.  I congratulated him for the salutary life changes! No changes are needed in  his regimen for now.  I refilled his prescriptions. - I suggested to:  Patient Instructions  Please continue: - Metformin 500 mg daily with dinner - Jardiance 10 mg daily before breakfast  Please return in 6 months with your sugar log.  - we checked his HbA1c: 6.1% (lower) - advised to  check sugars at different times of the day - 1x a day, rotating check times - he is not UTD with his eye exams - return to clinic in 6 months  2. HL -Reviewed latest lipid panel from 09/13/2020: 120/199/29/58 -triglycerides elevated, HDL low -Continues fish oil 1000 mg daily and also Crestor 10 mg daily without side effects  3.  Obesity class I -We will continue SGLT inhibitor which should also help with weight loss -He lost approximately 8 pounds since last visit (228 >> 220 lbs -but during his time at the resort, he lost: 25-30 lbs)  Carlus Pavlov, MD PhD [facility] Endocrinology

## 2020-11-20 NOTE — Patient Instructions (Signed)
Please continue: - Metformin 500 mg daily with dinner - Jardiance 10 mg daily before breakfast  Please return in 6 months with your sugar log.

## 2020-11-25 ENCOUNTER — Other Ambulatory Visit: Payer: Self-pay | Admitting: Cardiology

## 2020-11-25 DIAGNOSIS — E785 Hyperlipidemia, unspecified: Secondary | ICD-10-CM

## 2021-01-08 ENCOUNTER — Other Ambulatory Visit: Payer: Self-pay | Admitting: Cardiology

## 2021-01-15 ENCOUNTER — Other Ambulatory Visit: Payer: Self-pay | Admitting: Cardiology

## 2021-02-12 DIAGNOSIS — G4733 Obstructive sleep apnea (adult) (pediatric): Secondary | ICD-10-CM | POA: Diagnosis not present

## 2021-03-02 ENCOUNTER — Other Ambulatory Visit: Payer: Self-pay | Admitting: Cardiology

## 2021-03-02 DIAGNOSIS — E785 Hyperlipidemia, unspecified: Secondary | ICD-10-CM

## 2021-04-02 ENCOUNTER — Other Ambulatory Visit: Payer: Self-pay | Admitting: Cardiology

## 2021-04-02 ENCOUNTER — Other Ambulatory Visit: Payer: Self-pay | Admitting: Internal Medicine

## 2021-05-14 DIAGNOSIS — J069 Acute upper respiratory infection, unspecified: Secondary | ICD-10-CM | POA: Diagnosis not present

## 2021-05-14 DIAGNOSIS — J029 Acute pharyngitis, unspecified: Secondary | ICD-10-CM | POA: Diagnosis not present

## 2021-06-25 ENCOUNTER — Ambulatory Visit: Payer: BC Managed Care – PPO | Admitting: Internal Medicine

## 2021-06-25 ENCOUNTER — Other Ambulatory Visit: Payer: Self-pay

## 2021-06-25 ENCOUNTER — Encounter: Payer: Self-pay | Admitting: Internal Medicine

## 2021-06-25 VITALS — BP 110/70 | HR 79 | Ht 68.0 in | Wt 211.0 lb

## 2021-06-25 DIAGNOSIS — E6609 Other obesity due to excess calories: Secondary | ICD-10-CM | POA: Diagnosis not present

## 2021-06-25 DIAGNOSIS — E1165 Type 2 diabetes mellitus with hyperglycemia: Secondary | ICD-10-CM | POA: Diagnosis not present

## 2021-06-25 DIAGNOSIS — Z6834 Body mass index (BMI) 34.0-34.9, adult: Secondary | ICD-10-CM | POA: Diagnosis not present

## 2021-06-25 LAB — POCT GLYCOSYLATED HEMOGLOBIN (HGB A1C): Hemoglobin A1C: 6 % — AB (ref 4.0–5.6)

## 2021-06-25 NOTE — Progress Notes (Signed)
Patient ID: Joshua Phillips, male   DOB: Aug 03, 1960, 60 y.o.   MRN: [id number]   This visit occurred during the SARS-CoV-2 public health emergency.  Safety protocols were in place, including screening questions prior to the visit, additional usage of staff PPE, and extensive cleaning of exam room while observing appropriate contact time as indicated for disinfecting solutions.   HPI: Joshua Phillips is a 60 y.o.-year-old male, initially referred by his cardiologist, Dr. Jacinto Halim, returning for follow-up for DM2, dx in 06/2019, non-insulin-dependent, uncontrolled, with complications (CAD s/p stent 01/2010).  Last visit 7 months ago.  Interim history: At last visit, he returned after being in [place] in 05/2020 at another health retreat.  He was able to stop alcohol and sugar and he was exercising consistently.  Sugars were better.  Since then, restarted to drink some alcohol and relaxed his diet a little, but he went to the Detox program again 04/2021.  Afterwards, he again stopped alcohol completely, no dairy, no desserts.  He feels great. He continues yoga for 25 to 30 minutes every morning. No increased urination, blurry vision, nausea, chest pain. Recent cardiac evaluation by Dr. Jacinto Halim - carotid U/S and 2D Echo: normal.   Reviewed HbA1c levels: Lab Results  Component Value Date   HGBA1C 6.1 (A) 11/20/2020   HGBA1C 6.6 (A) 10/18/2019   HGBA1C 8.3 (H) 06/30/2019   HGBA1C (H) 11/03/2010    5.7 (NOTE)                                                                       According to the ADA Clinical Practice Recommendations for 2011, when HbA1c is used as a screening test:   >=6.5%   Diagnostic of Diabetes Mellitus           (if abnormal result  is confirmed)  5.7-6.4%   Increased risk of developing Diabetes Mellitus  References:Diagnosis and Classification of Diabetes Mellitus,Diabetes Care,2011,34(Suppl 1):S62-S69 and Standards of Medical Care in         Diabetes - 2011,Diabetes Care,2011,34  (Suppl  1):S11-S61.  09/13/2020: HbA1c 6.3%   Pt is on: - Metformin 500 mg 1x a day, with b'fast >> with dinner  - Jardiance 10 mg daily in am  Is checking sugars 1-2 times a day: - am: n/c >> 113-125 >> 90-114 >> 90-100 - 2h after b'fast: n/c >> 117-198 >> n/c - before lunch: n/c >> 122 >> n/c - 2h after lunch: n/c >> 139-186 >> n/c - before dinner: n/c >> 99-128 >> n/c - 2h after dinner: 142-182 >> up to 145 >> 120-130, 140 - bedtime: n/c >> 96-166 >> n/c - nighttime: n/c Lowest sugar was 74 >> 93 >> 90 >> 90; it is unclear at which level he has hypoglycemia awareness Highest sugar was 180 >> 198 >> 145 >> 140s.  Glucometer: Contour Next  Pt's meals are: - Breakfast: tea + millet bread or oatmeal or cereal or eggs (rarely) - Lunch: salad or sandwich - Dinner: veggies + flat breads, chicken/fish/beef; occas rice - Snacks:  + diet soda; nuts; indian snack He drinks bitter mellon juice - 2 days a week.  He feels that this is helping with his blood sugars.  -No CKD, last BUN/creatinine:  09/13/2020: 48/1.05, GFR 73, glucose 120 Lab Results  Component Value Date   BUN 11 07/23/2019   BUN 10 06/30/2019   CREATININE 1.06 07/23/2019   CREATININE 1.13 06/30/2019  On lisinopril 20.  -+ HL; last set of lipids:  09/13/2020: 120/199/29/68 Lab Results  Component Value Date   CHOL 132 06/30/2019   HDL 27 (L) 06/30/2019   LDLCALC 84 06/30/2019   TRIG 112 06/30/2019   CHOLHDL 4.9 06/30/2019  On Crestor 5 >> 10 + fish oil 1 g once a day. On ASA 81.  - last eye exam was in 2022: No DR. His xerophthalmia resolved.  -He denies numbness and tingling in his feet.  Pt has FH of DM in father.  Mother had PMR.  He also has a history of HTN. He has a history of transaminitis but his LFTs normalized 09/2020. He had GERD but this resolved after started C-PAP machine 09/2019 (moderate OSA).  He does 2-3 week detox treatments in [place] 1-2x a year ([facility]). He loses weight and feels better  after going to these retreats.  During the coronavirus pandemic, he could not exercise, was less active, and ate more >> started to feel poorly.  At that time, he was started on Jardiance by Dr. Jacinto Halim and referred to endocrinology.  ROS: + see HPI  I reviewed pt's medications, allergies, PMH, social hx, family hx, and changes were documented in the history of present illness. Otherwise, unchanged from my initial visit note.  Past Medical History:  Diagnosis Date   Hypertension    Past Surgical History:  Procedure Laterality Date   CARDIAC CATHETERIZATION     CARDIAC SURGERY     stent placement   Social History   Socioeconomic History   Marital status: Married    Spouse name: Not on file   Number of children: 2   Years of education: Not on file   Highest education level: Not on file  Occupational History   Not on file  Tobacco Use   Smoking status: Former    Packs/day: 0.25    Years: 10.00    Pack years: 2.50    Types: Cigarettes    Quit date: 20    Years since quitting: 25.9   Smokeless tobacco: Never  Vaping Use   Vaping Use: Never used  Substance and Sexual Activity   Alcohol use: Yes    Comment: occ   Drug use: No   Sexual activity: Yes  Other Topics Concern   Not on file  Social History Narrative   Not on file   Social Determinants of Health   Financial Resource Strain: Not on file  Food Insecurity: Not on file  Transportation Needs: Not on file  Physical Activity: Not on file  Stress: Not on file  Social Connections: Not on file  Intimate Partner Violence: Not on file   Current Outpatient Medications on File Prior to Visit  Medication Sig Dispense Refill   BRILINTA 60 MG TABS tablet Take 1 tablet by mouth twice daily 180 tablet 2   Cholecalciferol (VITAMIN D-3 PO) Take 1 capsule by mouth daily.     Coenzyme Q10 (CO Q-10 PO) Take 1 capsule by mouth daily.     empagliflozin (JARDIANCE) 10 MG TABS tablet Take 1 tablet (10 mg total) by mouth daily  before breakfast. 90 tablet 3   glucose blood (CONTOUR NEXT TEST) test strip USE  STRIP TO CHECK GLUCOSE AS DIRECTED ONCE DAILY 100 each 3  lisinopril-hydrochlorothiazide (ZESTORETIC) 20-12.5 MG tablet TAKE 1 TABLET BY MOUTH ONCE DAILY IN THE MORNING 90 tablet 0   metFORMIN (GLUCOPHAGE) 500 MG tablet Take 1 tablet (500 mg total) by mouth daily with supper. 90 tablet 3   metoprolol succinate (TOPROL-XL) 25 MG 24 hr tablet Take 1 tablet by mouth once daily 90 tablet 3   nitroGLYCERIN (NITROSTAT) 0.4 MG SL tablet DISSOLVE ONE TABLET UNDER THE TONGUE EVERY 5 MINUTES AS NEEDED FOR CHEST PAIN.  DO NOT EXCEED A TOTAL OF 3 DOSES IN 15 MINUTES 25 tablet 0   Omega-3 Fatty Acids (FISH OIL PO) Take 1 capsule by mouth daily.     rosuvastatin (CRESTOR) 10 MG tablet Take 1 tablet by mouth once daily 90 tablet 2   No current facility-administered medications on file prior to visit.   No Known Allergies Family History  Problem Relation Age of Onset   Heart attack Father    Diabetes Father    Hypertension Mother    Stroke Mother    PE: BP 110/70 (BP Location: Left Arm, Patient Position: Sitting, Cuff Size: Normal)   Pulse 79   Ht  (1.727 m)   Wt 211 lb (95.7 kg)   SpO2 95%   BMI 32.08 kg/m  Wt Readings from Last 3 Encounters:  06/25/21 211 lb (95.7 kg)  11/20/20 220 lb 9.6 oz (100.1 kg)  11/06/20 220 lb 12.8 oz (100.2 kg)   Constitutional: overweight, in NAD Eyes: PERRLA, EOMI, no exophthalmos ENT: moist mucous membranes, no thyromegaly, no cervical lymphadenopathy Cardiovascular: RRR, No MRG Respiratory: CTA B Musculoskeletal: no deformities, strength intact in all 4 Skin: moist, warm, no rashes Neurological: no tremor with outstretched hands, DTR normal in all 4  ASSESSMENT: 1. DM2, non-insulin-dependent, uncontrolled, with complications - CAD, s/p stent 2011 - Dr. Jacinto Halim  PLAN:  1. Patient with longstanding, previously  uncontrolled, type 2 diabetes, on oral antidiabetic regimen  with metformin and SGLT2 inhibitor, who returned at last visit after long absence of a year.  HbA1c was improved at that time, at 6.1%.  We did not change his regimen since sugars were improved.  At last visit, he describes that after his health retreat in [place], he stopped any alcohol (he was drinking 2-3 drinks every night) and also stopped eating sugar.  At that time, I did advise him to try to check his blood sugars more frequently, he states he was only checking them 1-2 times a week. -At this visit, sugars are all at goal and he continues [facility] his detox program.  2 months ago he went again for the detox we treat anemia and afterwards he continued without concentrated sweets, dairy, alcohol.  He also continues doing yoga every morning.  He feels great and would like to continue with this program. -At this visit, I advised him to continue the current regimen - I suggested to:  Patient Instructions  Please continue: - Metformin 500 mg daily with dinner - Jardiance 10 mg daily before breakfast  Please return in 1 year with your sugar log.  - we checked his HbA1c: 6.0% (lower) - advised to check sugars at different times of the day - 1x a day, rotating check times - advised for yearly eye exams >> he is UTD - return to clinic in 1 year per his request  2. HL -Reviewed latest lipid panel from 09/13/2020: 120/199/29/58 -triglycerides elevated, HDL low -He continues on Crestor 10 mg daily and fish oil 1000 mg daily without side  effects  3.  Obesity class I -We will continue Jardiance, which should also help with weight loss -Before last visit, he lost approximately 8 pounds (228 >> 220 lbs -but during his time at the health resort, he lost: 25-30 lbs) -Since last visit, he lost 9 more pounds.  Carlus Pavlov, MD PhD [facility] Endocrinology

## 2021-06-25 NOTE — Patient Instructions (Addendum)
Please continue: - Metformin 500 mg daily with dinner - Jardiance 10 mg daily before breakfast  Please return in 1 year with your sugar log.

## 2021-07-06 ENCOUNTER — Other Ambulatory Visit: Payer: Self-pay | Admitting: Cardiology

## 2021-07-11 ENCOUNTER — Other Ambulatory Visit: Payer: Self-pay

## 2021-07-11 ENCOUNTER — Telehealth: Payer: Self-pay | Admitting: Cardiology

## 2021-07-11 MED ORDER — METOPROLOL SUCCINATE ER 25 MG PO TB24: 25.0000 mg | ORAL_TABLET | Freq: Every day | ORAL | 3 refills | Status: DC

## 2021-07-11 NOTE — Telephone Encounter (Signed)
Patient requesting refill for metoprolol. Says [facility] pharmacy told him there are no refills and have been trying to get in touch with [place]. Phone number to contact patient is [phone].

## 2021-10-18 ENCOUNTER — Other Ambulatory Visit: Payer: Self-pay | Admitting: Cardiology

## 2021-11-08 ENCOUNTER — Ambulatory Visit: Payer: BC Managed Care – PPO | Admitting: Cardiology

## 2021-11-08 ENCOUNTER — Encounter: Payer: Self-pay | Admitting: Cardiology

## 2021-11-08 VITALS — BP 112/74 | HR 63 | Temp 98.2°F | Resp 17 | Ht 68.0 in | Wt 214.8 lb

## 2021-11-08 DIAGNOSIS — E785 Hyperlipidemia, unspecified: Secondary | ICD-10-CM | POA: Diagnosis not present

## 2021-11-08 DIAGNOSIS — I10 Essential (primary) hypertension: Secondary | ICD-10-CM | POA: Diagnosis not present

## 2021-11-08 DIAGNOSIS — E559 Vitamin D deficiency, unspecified: Secondary | ICD-10-CM

## 2021-11-08 DIAGNOSIS — I25118 Atherosclerotic heart disease of native coronary artery with other forms of angina pectoris: Secondary | ICD-10-CM

## 2021-11-08 DIAGNOSIS — E119 Type 2 diabetes mellitus without complications: Secondary | ICD-10-CM | POA: Diagnosis not present

## 2021-11-08 NOTE — Progress Notes (Signed)
? ? ?Primary Physician/Referring:  Donald Prose, MD ? ?Patient ID: Joshua Phillips, male    DOB: 1961-05-10, 61 y.o.   MRN: [id number] ? ?Chief Complaint  ?Patient presents with  ? Hyperlipidemia  ?  1 YEAR  ? Coronary Artery Disease  ? ?HPI:   ? ?Joshua Phillips  is a 61 y.o. Asian [place] male with CAD S/P stenting to his distal right coronary artery in July 2011 for exertional angina pectoris with resolution of symptoms, hypertension, hyperlipidemia, newly diagnosed diabetes mellitus and moderate to severe obstructive sleep apnea diagnosed in February 2021 and has been compliant with CPAP presents for 12 month follow-up visit. ? ?He is presently doing well and has been compliant with CPAP.  Previously he had elevated LFTs, but has quit drinking alcohol with normalization almost a year ago now.  He has had a few episodes of left-sided neck discomfort and facial numbness, and is worried about angina pectoris.  No headache, no visual disturbances.  No neurologic deficits.  He also felt tingling and numbness in his hands at the same time. ? ? ?Past Medical History:  ?Diagnosis Date  ? Hypertension   ? ?Past Surgical History:  ?Procedure Laterality Date  ? CARDIAC CATHETERIZATION    ? CARDIAC SURGERY    ? stent placement  ? ?Family History  ?Problem Relation Age of Onset  ? Hypertension Mother   ? Stroke Mother   ? Heart attack Father   ? Diabetes Father   ?  ?Social History  ? ?Tobacco Use  ? Smoking status: Former  ?  Packs/day: 0.25  ?  Years: 10.00  ?  Pack years: 2.50  ?  Types: Cigarettes  ?  Quit date: 1997  ?  Years since quitting: 26.3  ? Smokeless tobacco: Never  ?Substance Use Topics  ? Alcohol use: Not Currently  ?  Comment: occ  ? ?Marital Status: Married  ?ROS  ?Review of Systems  ?Cardiovascular:  Negative for chest pain, dyspnea on exertion and leg swelling.  ?Respiratory:  Positive for snoring (OSA on CPAP).   ?Gastrointestinal:  Negative for melena.  ?Objective  ?Blood pressure 112/74, pulse 63,  temperature 98.2 ?F (36.8 ?C), temperature source Temporal, resp. rate 17, height _0  (1.727 m), weight 214 lb 12.8 oz (97.4 kg), SpO2 96 %.  ? ?  11/08/2021  ?  9:37 AM 06/25/2021  ? 10:56 AM 11/20/2020  ? 11:45 AM  ?Vitals with BMI  ?Height _1  _2  _3   ?Weight 214 lbs 13 oz 211 lbs 220 lbs 10 oz  ?BMI 32.67 32.09 33.55  ?Systolic 361 443 154  ?Diastolic 74 70 82  ?Pulse 63 79 66  ?  ? Physical Exam ?Neck:  ?   Vascular: No JVD.  ?Cardiovascular:  ?   Rate and Rhythm: Normal rate and regular rhythm.  ?   Pulses: Intact distal pulses.  ?   Heart sounds: Normal heart sounds. No murmur heard. ?  No gallop.  ?Pulmonary:  ?   Effort: Pulmonary effort is normal.  ?   Breath sounds: Normal breath sounds.  ?Abdominal:  ?   General: Bowel sounds are normal.  ?   Palpations: Abdomen is soft.  ?Musculoskeletal:  ?   Right lower leg: No edema.  ?   Left lower leg: No edema.  ? ?Laboratory examination:  ? ?No results for input(s): NA, K, CL, CO2, GLUCOSE, BUN, CREATININE, CALCIUM, GFRNONAA, GFRAA in the last 8760 hours. ?CrCl cannot  be calculated (Patient's most recent lab result is older than the maximum 21 days allowed.).  ? ?  Latest Ref Rng & Units 07/23/2019  ? 11:55 AM 06/30/2019  ? 11:01 AM 09/29/2016  ?  5:14 AM  ?CMP  ?Glucose 65 - 99 mg/dL 180   176   101    ?BUN 6 - 24 mg/dL _0 ?Creatinine 0.76 - 1.27 mg/dL 1.06   1.13   0.84    ?Sodium 134 - 144 mmol/L 142   139   140    ?Potassium 3.5 - 5.2 mmol/L 4.4   4.4   3.8    ?Chloride 96 - 106 mmol/L 103   103   110    ?CO2 20 - 29 mmol/L _1 ?Calcium 8.7 - 10.2 mg/dL 10.0   10.9   8.9    ?Total Protein 6.0 - 8.5 g/dL  7.4     ?Total Bilirubin 0.0 - 1.2 mg/dL  0.6     ?Alkaline Phos 39 - 117 IU/L  81     ?AST 0 - 40 IU/L  44     ?ALT 0 - 44 IU/L  52     ? ? ?  Latest Ref Rng & Units 06/30/2019  ? 11:00 AM 09/29/2016  ?  5:14 AM 09/28/2016  ?  7:28 AM  ?CBC  ?WBC 3.4 - 10.8 x10E3/uL 7.2   5.8   4.6    ?Hemoglobin 13.0 - 17.7 g/dL 15.0   14.1   14.3     ?Hematocrit 37.5 - 51.0 % 43.6   40.6   41.4    ?Platelets 150 - 450 x10E3/uL 252   234   214    ? ?Lab Results  ?Component Value Date  ? CHOL 132 06/30/2019  ? HDL 27 (L) 06/30/2019  ? [place] 84 06/30/2019  ? TRIG 112 06/30/2019  ? CHOLHDL 4.9 06/30/2019  ?  ?HEMOGLOBIN A1C ?Lab Results  ?Component Value Date  ? HGBA1C 6.0 (A) 06/25/2021  ? MPG 117 (H) 11/03/2010  ? ?TSH ?TSH 1.640 06/30/2019 ? ?External labs:  ? ?Labs 09/13/2020: ? ?PSA normal at 0.33, creatinine ? ?LFTs normal.  Serum glucose 120 mg, BUN 14, creatinine 1.05, EGFR 73 mL. ? ?A1c 6.3%. ? ?T. Chol 120, triglycerides 199, HDL 29, LDL 58.  Non-HDL cholesterol 91. ? ?Medications and allergies  ?No Known Allergies  ? ?Current Outpatient Medications:  ?  BRILINTA 60 MG TABS tablet, Take 1 tablet by mouth twice daily, Disp: 180 tablet, Rfl: 2 ?  Cholecalciferol (VITAMIN D-3 PO), Take 1 capsule by mouth daily., Disp: , Rfl:  ?  Coenzyme Q10 (CO Q-10 PO), Take 1 capsule by mouth daily., Disp: , Rfl:  ?  empagliflozin (JARDIANCE) 10 MG TABS tablet, Take 1 tablet (10 mg total) by mouth daily before breakfast., Disp: 90 tablet, Rfl: 3 ?  glucose blood (CONTOUR NEXT TEST) test strip, USE  STRIP TO CHECK GLUCOSE AS DIRECTED ONCE DAILY, Disp: 100 each, Rfl: 3 ?  lisinopril-hydrochlorothiazide (ZESTORETIC) 20-12.5 MG tablet, TAKE 1 TABLET BY MOUTH ONCE DAILY IN THE MORNING, Disp: 90 tablet, Rfl: 0 ?  metFORMIN (GLUCOPHAGE) 500 MG tablet, Take 1 tablet (500 mg total) by mouth daily with supper., Disp: 90 tablet, Rfl: 3 ?  metoprolol succinate (TOPROL-XL) 25 MG 24 hr tablet, Take 1 tablet (25 mg total) by mouth daily., Disp: 90 tablet, Rfl:  3 ?  nitroGLYCERIN (NITROSTAT) 0.4 MG SL tablet, DISSOLVE ONE TABLET UNDER THE TONGUE EVERY 5 MINUTES AS NEEDED FOR CHEST PAIN.  DO NOT EXCEED A TOTAL OF 3 DOSES IN 15 MINUTES, Disp: 25 tablet, Rfl: 0 ?  Omega-3 Fatty Acids (FISH OIL PO), Take 1 capsule by mouth daily., Disp: , Rfl:  ?  rosuvastatin (CRESTOR) 10 MG tablet, Take  1 tablet by mouth once daily, Disp: 90 tablet, Rfl: 2  ?  ?Radiology:  ? ?CT Angio Chest with Contrast 09/27/2016: Areas of coronary artery calcification. ? ?Cardiac Studies:  ? ?Treadmill stress test. 03/29/2013 Treadmill exercise stress test 03/29/2013: Bruce protocol, patient exercised for 9 minutes and 15 seconds, achieved 10.5 METS, did have chest discomfort associated with 1.5-2 mm inferior and lateral ST segment depression which resolved at greater than 2 minutes into recovery ? ?PTCA and stent to distal RCA 02/02/2010 2.5x18 mm Promus DES. Coronary angio 11/14/2010: Mild diffuse CAD of all three vessels. Stent patent. Ramus intermediate small with mid 90% stenosis. ? ?Home Sleep Study 09/02/2019: ?Moderate -severe obstructive sleep apnea at AHI of 25.8/h with accentuation in REM sleep to 31.2/h. There is a trend to bradycardia noted, no hypoxemia events were correlated. CPAP recommended.  ? ?EKG ?  ?EKG 11/06/2020: Normal sinus rhythm at rate of 65 bpm, normal axis.  No evidence of ischemia, normal EKG. No significant change from 07/02/2019  ? ?Assessment  ? ?  ICD-10-CM   ?1. Coronary artery disease of native artery of native heart with stable angina pectoris ([place])  I25.118 Lipid Panel With LDL/HDL Ratio  ?  PCV MYOCARDIAL PERFUSION WO LEXISCAN  ?  PCV ECHOCARDIOGRAM COMPLETE  ?  ?2. Primary hypertension  I10 EKG 12-Lead  ?  COMPLETE METABOLIC PANEL WITH GFR  ?  ?3. Hyperlipidemia LDL goal <70  E78.5   ?  ?4. Type 2 diabetes mellitus without complication, without long-term current use of insulin (HCC)  E11.9 Hgb A1c w/o eAG  ?  ?5. Hypovitaminosis D  E55.9 VITAMIN D 25 Hydroxy (Vit-D Deficiency, Fractures)  ?  ?  ?No orders of the defined types were placed in this encounter. ?  ?There are no discontinued medications.  ?Recommendations:  ? ?DARIO YONO  is a 60 y.o. [place] male with CAD S/P stenting to his distal right coronary artery in July 2011 for exertional angina pectoris with resolution of  symptoms, hypertension, hyperlipidemia, newly diagnosed diabetes mellitus and moderate to severe obstructive sleep apnea diagnosed in February 2021 and has been compliant with CPAP. ? ?He has had a few episodes of le

## 2021-11-09 ENCOUNTER — Encounter: Payer: Self-pay | Admitting: Internal Medicine

## 2021-11-09 DIAGNOSIS — E559 Vitamin D deficiency, unspecified: Secondary | ICD-10-CM | POA: Diagnosis not present

## 2021-11-09 DIAGNOSIS — E119 Type 2 diabetes mellitus without complications: Secondary | ICD-10-CM | POA: Diagnosis not present

## 2021-11-09 DIAGNOSIS — I25118 Atherosclerotic heart disease of native coronary artery with other forms of angina pectoris: Secondary | ICD-10-CM | POA: Diagnosis not present

## 2021-11-10 LAB — LIPID PANEL WITH LDL/HDL RATIO
Cholesterol, Total: 125 mg/dL (ref 100–199)
HDL: 32 mg/dL — ABNORMAL LOW (ref >39)
LDL Chol Calc (NIH): 72 mg/dL (ref 0–99)
LDL/HDL Ratio: 2.3 ratio (ref 0.0–3.6)
Triglycerides: 111 mg/dL (ref 0–149)
VLDL Cholesterol Cal: 21 mg/dL (ref 5–40)

## 2021-11-10 LAB — VITAMIN D 25 HYDROXY (VIT D DEFICIENCY, FRACTURES): Vit D, 25-Hydroxy: 53.9 ng/mL (ref 30.0–100.0)

## 2021-11-10 LAB — HGB A1C W/O EAG: Hgb A1c MFr Bld: 6.1 % — ABNORMAL HIGH (ref 4.8–5.6)

## 2021-11-10 NOTE — Progress Notes (Signed)
FYI. Aic Good. Wondering if Reginal Lutes would be appropriate for the patient

## 2021-11-14 ENCOUNTER — Ambulatory Visit: Payer: BC Managed Care – PPO

## 2021-11-14 DIAGNOSIS — I25118 Atherosclerotic heart disease of native coronary artery with other forms of angina pectoris: Secondary | ICD-10-CM | POA: Diagnosis not present

## 2021-11-15 ENCOUNTER — Ambulatory Visit: Payer: BC Managed Care – PPO | Admitting: Internal Medicine

## 2021-11-15 ENCOUNTER — Encounter: Payer: Self-pay | Admitting: Internal Medicine

## 2021-11-15 VITALS — BP 120/82 | HR 61 | Ht 68.0 in | Wt 214.8 lb

## 2021-11-15 DIAGNOSIS — E1165 Type 2 diabetes mellitus with hyperglycemia: Secondary | ICD-10-CM | POA: Diagnosis not present

## 2021-11-15 DIAGNOSIS — E785 Hyperlipidemia, unspecified: Secondary | ICD-10-CM

## 2021-11-15 DIAGNOSIS — Z6834 Body mass index (BMI) 34.0-34.9, adult: Secondary | ICD-10-CM | POA: Diagnosis not present

## 2021-11-15 DIAGNOSIS — E6609 Other obesity due to excess calories: Secondary | ICD-10-CM | POA: Diagnosis not present

## 2021-11-15 MED ORDER — OZEMPIC (0.25 OR 0.5 MG/DOSE) 2 MG/1.5ML ~~LOC~~ SOPN: 0.5000 mg | PEN_INJECTOR | SUBCUTANEOUS | 5 refills | Status: DC

## 2021-11-15 NOTE — Patient Instructions (Signed)
Please continue: ?- Metformin 500 mg daily with dinner ?- Jardiance 10 mg daily before breakfast ? ?Please start: ?- Ozempic 0.25 mg weekly in a.m. (for example on Sunday morning) x 4 weeks, then increase to 0.5 mg weekly in a.m. if no nausea or hypoglycemia. ? ?Please return in 4-6 mo with your sugar log. ?

## 2021-11-15 NOTE — Progress Notes (Addendum)
Patient ID: Joshua Phillips, male   DOB: 12/03/1960, 61 y.o.   MRN: [id number]  ? ?This visit occurred during the SARS-CoV-2 public health emergency.  Safety protocols were in place, including screening questions prior to the visit, additional usage of staff PPE, and extensive cleaning of exam room while observing appropriate contact time as indicated for disinfecting solutions.  ? ?HPI: ?Joshua Phillips is a 61 y.o.-year-old male, initially referred by his cardiologist, Dr. Einar Gip, returning for follow-up for DM2, dx in 06/2019, non-insulin-dependent, uncontrolled, with complications (CAD s/p stent 01/2010).  Last visit 7 months ago. ? ?Interim history: ?At last visit, he returned after being in [place] in 05/2020 at another health retreat.  He was able to stop alcohol and sugar and he was exercising consistently.  Sugars were better.  Since then, restarted to drink some alcohol and relaxed his diet a little, but he went to the Detox program again 04/2021.  Afterwards, he again stopped alcohol completely, no dairy, no desserts.  He feels great. ?He continues yoga for 25 to 30 minutes every morning. ?No increased urination, blurry vision, nausea, chest pain. ? ?Reviewed HbA1c levels: ?Lab Results  ?Component Value Date  ? HGBA1C 6.1 (H) 11/09/2021  ? HGBA1C 6.0 (A) 06/25/2021  ? HGBA1C 6.1 (A) 11/20/2020  ? HGBA1C 6.6 (A) 10/18/2019  ? HGBA1C 8.3 (H) 06/30/2019  ? HGBA1C (H) 11/03/2010  ?  5.7 ?(NOTE)                                                                       According to the ADA Clinical Practice Recommendations for 2011, when HbA1c is used as a screening test:   >=6.5%   Diagnostic of Diabetes Mellitus           (if abnormal result ? is confirmed)  5.7-6.4%   Increased risk of developing Diabetes Mellitus  References:Diagnosis and Classification of Diabetes Mellitus,Diabetes [id number] 1):S62-S69 and Standards of Medical Care in         Diabetes - 2011,Diabetes [id number]  ?(Suppl 1):S11-S61.   ?09/13/2020: HbA1c 6.3%  ? ?Pt is on: ?- Metformin 500 mg 1x a day, with b'fast >> with dinner  ?- Jardiance 10 mg daily in am ? ?Is checking sugars 1-2 times a day: ?- am: n/c >> 113-125 >> 90-114 >> 90-100 >> 95-107 ?- 2h after b'fast: n/c >> 117-198 >> n/c ?- before lunch: n/c >> 122 >> n/c ?- 2h after lunch: n/c >> 139-186 >> n/c ?- before dinner: n/c >> 99-128 >> n/c ?- 2h after dinner: 142-182 >> up to 145 >> 120-130, 140 >> 108-120s ?- bedtime: n/c >> 96-166 >> n/c ?- nighttime: n/c ?Lowest sugar was 93 >> 90 >> 90 >> 95; it is unclear at which level he has hypoglycemia awareness ?Highest sugar was 198 >> 145 >> 140s >> 120s. ? ?Glucometer: Contour Next ? ?Pt's meals are: ?- Breakfast: tea + millet bread or oatmeal or cereal or eggs (rarely) ?- Lunch: salad or sandwich ?- Dinner: veggies + flat breads, chicken/fish/beef; occas rice ?- Snacks:  + diet soda; nuts; indian snack ?He drinks bitter mellon juice - 2 days a week.  He feels that this is helping with his blood sugars. ? ?-No CKD,  last BUN/creatinine:  ?04/2021 - will send these to me ?09/13/2020: 48/1.05, GFR 73, glucose 120 ?Lab Results  ?Component Value Date  ? BUN 11 07/23/2019  ? BUN 10 06/30/2019  ? CREATININE 1.06 07/23/2019  ? CREATININE 1.13 06/30/2019  ?On lisinopril 20. ? ?-+ HL; last set of lipids: ?Lab Results  ?Component Value Date  ? CHOL 125 11/09/2021  ? HDL 32 (L) 11/09/2021  ? [place] 72 11/09/2021  ? TRIG 111 11/09/2021  ? CHOLHDL 4.9 06/30/2019  ?09/13/2020: 120/199/29/68 ?On Crestor 5 >> 10 + fish oil 1 g once a day. ?On ASA 81. ? ?- last eye exam was in 2022: No DR. His xerophthalmia resolved. ? ?-He denies numbness and tingling in his feet. ? ?Pt has FH of DM in father. ? ?Mother had PMR. ? ?He also has a history of HTN. He has a history of transaminitis but his LFTs normalized 09/2020. ?He had GERD but this resolved after started C-PAP machine 09/2019 (moderate OSA). ? ?He does 2-3 week detox treatments in [place] 1-2x a year ([place]). He loses weight and feels better after going to these retreats. ? ?During the coronavirus pandemic, he could not exercise, was less active, and ate more >> started to feel poorly.  At that time, he was started on Jardiance by Dr. Einar Gip and referred to endocrinology. ?In 2022, he had cardiac evaluation by Dr. Einar Gip - carotid U/S and 2D Echo: normal.  ? ?ROS: ?+ see HPI ? ?I reviewed pt's medications, allergies, PMH, social hx, family hx, and changes were documented in the history of present illness. Otherwise, unchanged from my initial visit note. ? ?Past Medical History:  ?Diagnosis Date  ? Hypertension   ? ?Past Surgical History:  ?Procedure Laterality Date  ? CARDIAC CATHETERIZATION    ? CARDIAC SURGERY    ? stent placement  ? ?Social History  ? ?Socioeconomic History  ? Marital status: Married  ?  Spouse name: Not on file  ? Number of children: 2  ? Years of education: Not on file  ? Highest education level: Not on file  ?Occupational History  ? Not on file  ?Tobacco Use  ? Smoking status: Former  ?  Packs/day: 0.25  ?  Years: 10.00  ?  Pack years: 2.50  ?  Types: Cigarettes  ?  Quit date: 1997  ?  Years since quitting: 26.3  ? Smokeless tobacco: Never  ?Vaping Use  ? Vaping Use: Never used  ?Substance and Sexual Activity  ? Alcohol use: Not Currently  ?  Comment: occ  ? Drug use: No  ? Sexual activity: Yes  ?Other Topics Concern  ? Not on file  ?Social History Narrative  ? Not on file  ? ?Social Determinants of Health  ? ?Financial Resource Strain: Not on file  ?Food Insecurity: Not on file  ?Transportation Needs: Not on file  ?Physical Activity: Not on file  ?Stress: Not on file  ?Social Connections: Not on file  ?Intimate Partner Violence: Not on file  ? ?Current Outpatient Medications on File Prior to Visit  ?Medication Sig Dispense Refill  ? BRILINTA 60 MG TABS tablet Take 1 tablet by mouth twice daily 180 tablet 2  ? Cholecalciferol (VITAMIN D-3 PO) Take 1 capsule by mouth daily.    ? Coenzyme  Q10 (CO Q-10 PO) Take 1 capsule by mouth daily.    ? empagliflozin (JARDIANCE) 10 MG TABS tablet Take 1 tablet (10 mg total) by mouth daily before breakfast.  90 tablet 3  ? glucose blood (CONTOUR NEXT TEST) test strip USE  STRIP TO CHECK GLUCOSE AS DIRECTED ONCE DAILY 100 each 3  ? lisinopril-hydrochlorothiazide (ZESTORETIC) 20-12.5 MG tablet TAKE 1 TABLET BY MOUTH ONCE DAILY IN THE MORNING 90 tablet 0  ? metFORMIN (GLUCOPHAGE) 500 MG tablet Take 1 tablet (500 mg total) by mouth daily with supper. 90 tablet 3  ? metoprolol succinate (TOPROL-XL) 25 MG 24 hr tablet Take 1 tablet (25 mg total) by mouth daily. 90 tablet 3  ? nitroGLYCERIN (NITROSTAT) 0.4 MG SL tablet DISSOLVE ONE TABLET UNDER THE TONGUE EVERY 5 MINUTES AS NEEDED FOR CHEST PAIN.  DO NOT EXCEED A TOTAL OF 3 DOSES IN 15 MINUTES 25 tablet 0  ? Omega-3 Fatty Acids (FISH OIL PO) Take 1 capsule by mouth daily.    ? rosuvastatin (CRESTOR) 10 MG tablet Take 1 tablet by mouth once daily 90 tablet 2  ? ?No current facility-administered medications on file prior to visit.  ? ?No Known Allergies ?Family History  ?Problem Relation Age of Onset  ? Hypertension Mother   ? Stroke Mother   ? Heart attack Father   ? Diabetes Father   ? ?PE: ?BP 120/82 (BP Location: Left Arm, Patient Position: Sitting, Cuff Size: Normal)   Pulse 61   Ht 5\' 8"  (1.727 m)   Wt 214 lb 12.8 oz (97.4 kg)   SpO2 96%   BMI 32.66 kg/m?  ?Wt Readings from Last 3 Encounters:  ?11/15/21 214 lb 12.8 oz (97.4 kg)  ?11/08/21 214 lb 12.8 oz (97.4 kg)  ?06/25/21 211 lb (95.7 kg)  ? ?Constitutional: overweight, in NAD ?Eyes: PERRLA, EOMI, no exophthalmos ?ENT: moist mucous membranes, no thyromegaly, no cervical lymphadenopathy ?Cardiovascular: RRR, No MRG ?Respiratory: CTA B ?Musculoskeletal: no deformities, strength intact in all 4 ?Skin: moist, warm, no rashes ?Neurological: no tremor with outstretched hands, DTR normal in all 4 ? ?ASSESSMENT: ?1. DM2, non-insulin-dependent, uncontrolled, with  complications ?- CAD, s/p stent 2011 - Dr. Einar Gip ? ?PLAN:  ?1. Patient with longstanding, previously uncontrolled type 2 diabetes, on oral antidiabetic regimen with metformin and SGLT2 inhibitor, with now good control.  [place]

## 2021-11-16 ENCOUNTER — Other Ambulatory Visit (HOSPITAL_COMMUNITY): Payer: Self-pay

## 2021-11-16 ENCOUNTER — Ambulatory Visit: Payer: BC Managed Care – PPO | Admitting: Internal Medicine

## 2021-11-16 ENCOUNTER — Telehealth: Payer: Self-pay | Admitting: Pharmacy Technician

## 2021-11-16 NOTE — Telephone Encounter (Signed)
Patient Advocate Encounter ?  ?Received notification that prior authorization for Ozempic (0.25 or 0.5 MG/DOSE) 2MG /3ML pen-injectors is required. ?  ?PA submitted on 11/16/2021 ?Key  [id number] ?Status is pending ?   ?Patient Advocate Encounter ? ?Prior Authorization for Ozempic (0.25 or 0.5 MG/DOSE) 2MG /3ML pen-injectors has been approved.   ? ?PA# [id number] ?Effective dates: 11/16/2021 through 11/15/2022 ? ?May take up to 72 hours to process at pharmacy level. ? ? ?Lady Deutscher, CPhT-Adv ?Pharmacy Patient Advocate Specialist ?[facility] Patient Advocate Team ?Direct Number: [phone]  Fax: [phone] ? ?

## 2021-11-19 ENCOUNTER — Other Ambulatory Visit: Payer: Self-pay | Admitting: Cardiology

## 2021-11-26 ENCOUNTER — Ambulatory Visit: Payer: BC Managed Care – PPO

## 2021-11-26 DIAGNOSIS — I25118 Atherosclerotic heart disease of native coronary artery with other forms of angina pectoris: Secondary | ICD-10-CM

## 2021-11-27 LAB — PCV MYOCARDIAL PERFUSION WO LEXISCAN
Angina Index: 0
ST Depression (mm): 0.5 mm

## 2021-11-28 ENCOUNTER — Other Ambulatory Visit: Payer: Self-pay | Admitting: Internal Medicine

## 2021-11-28 ENCOUNTER — Other Ambulatory Visit: Payer: Self-pay | Admitting: Cardiology

## 2021-11-28 DIAGNOSIS — E1165 Type 2 diabetes mellitus with hyperglycemia: Secondary | ICD-10-CM

## 2021-11-28 DIAGNOSIS — E785 Hyperlipidemia, unspecified: Secondary | ICD-10-CM

## 2021-11-28 NOTE — Progress Notes (Signed)
Discussed with patient. Low risk stress test. Continue medical therapy

## 2022-01-03 LAB — HM DIABETES EYE EXAM

## 2022-01-05 ENCOUNTER — Encounter: Payer: Self-pay | Admitting: Internal Medicine

## 2022-01-06 ENCOUNTER — Other Ambulatory Visit: Payer: Self-pay | Admitting: Cardiology

## 2022-01-07 MED ORDER — LISINOPRIL-HYDROCHLOROTHIAZIDE 20-12.5 MG PO TABS
1.0000 | ORAL_TABLET | Freq: Every morning | ORAL | 0 refills | Status: DC
Start: 2022-01-07 — End: 2022-03-31

## 2022-01-11 DIAGNOSIS — E785 Hyperlipidemia, unspecified: Secondary | ICD-10-CM | POA: Diagnosis not present

## 2022-01-11 DIAGNOSIS — Z125 Encounter for screening for malignant neoplasm of prostate: Secondary | ICD-10-CM | POA: Diagnosis not present

## 2022-01-11 DIAGNOSIS — Z0000 Encounter for general adult medical examination without abnormal findings: Secondary | ICD-10-CM | POA: Diagnosis not present

## 2022-01-11 DIAGNOSIS — Z23 Encounter for immunization: Secondary | ICD-10-CM | POA: Diagnosis not present

## 2022-01-18 DIAGNOSIS — Z1211 Encounter for screening for malignant neoplasm of colon: Secondary | ICD-10-CM | POA: Diagnosis not present

## 2022-02-10 ENCOUNTER — Other Ambulatory Visit: Payer: Self-pay | Admitting: Cardiology

## 2022-02-18 ENCOUNTER — Other Ambulatory Visit: Payer: Self-pay | Admitting: Cardiology

## 2022-02-18 DIAGNOSIS — E785 Hyperlipidemia, unspecified: Secondary | ICD-10-CM

## 2022-03-31 ENCOUNTER — Other Ambulatory Visit: Payer: Self-pay | Admitting: Cardiology

## 2022-03-31 DIAGNOSIS — E785 Hyperlipidemia, unspecified: Secondary | ICD-10-CM

## 2022-04-01 MED ORDER — NITROGLYCERIN 0.4 MG SL SUBL: 0.4000 mg | SUBLINGUAL_TABLET | SUBLINGUAL | 0 refills | Status: DC | PRN

## 2022-04-01 MED ORDER — ROSUVASTATIN CALCIUM 10 MG PO TABS: 10.0000 mg | ORAL_TABLET | Freq: Every day | ORAL | 0 refills | Status: DC

## 2022-04-01 MED ORDER — TICAGRELOR 60 MG PO TABS: 60.0000 mg | ORAL_TABLET | Freq: Two times a day (BID) | ORAL | 0 refills | Status: DC

## 2022-04-01 MED ORDER — LISINOPRIL-HYDROCHLOROTHIAZIDE 20-12.5 MG PO TABS: 1.0000 | ORAL_TABLET | Freq: Every morning | ORAL | 0 refills | Status: DC

## 2022-05-22 ENCOUNTER — Other Ambulatory Visit: Payer: Self-pay | Admitting: Internal Medicine

## 2022-05-22 DIAGNOSIS — E1165 Type 2 diabetes mellitus with hyperglycemia: Secondary | ICD-10-CM

## 2022-05-29 ENCOUNTER — Other Ambulatory Visit: Payer: Self-pay

## 2022-05-29 DIAGNOSIS — E1165 Type 2 diabetes mellitus with hyperglycemia: Secondary | ICD-10-CM

## 2022-05-29 MED ORDER — EMPAGLIFLOZIN 10 MG PO TABS: 10.0000 mg | ORAL_TABLET | Freq: Every day | ORAL | 0 refills | Status: DC

## 2022-05-31 ENCOUNTER — Ambulatory Visit: Payer: BC Managed Care – PPO | Admitting: Internal Medicine

## 2022-05-31 ENCOUNTER — Encounter: Payer: Self-pay | Admitting: Internal Medicine

## 2022-05-31 VITALS — BP 108/62 | HR 64 | Ht 68.0 in | Wt 190.2 lb

## 2022-05-31 DIAGNOSIS — E6609 Other obesity due to excess calories: Secondary | ICD-10-CM

## 2022-05-31 DIAGNOSIS — Z6834 Body mass index (BMI) 34.0-34.9, adult: Secondary | ICD-10-CM

## 2022-05-31 DIAGNOSIS — E1165 Type 2 diabetes mellitus with hyperglycemia: Secondary | ICD-10-CM | POA: Diagnosis not present

## 2022-05-31 DIAGNOSIS — E785 Hyperlipidemia, unspecified: Secondary | ICD-10-CM | POA: Diagnosis not present

## 2022-05-31 LAB — POCT GLYCOSYLATED HEMOGLOBIN (HGB A1C): Hemoglobin A1C: 5.3 % (ref 4.0–5.6)

## 2022-05-31 MED ORDER — EMPAGLIFLOZIN 10 MG PO TABS: 10.0000 mg | ORAL_TABLET | Freq: Every day | ORAL | 3 refills | Status: DC

## 2022-05-31 NOTE — Patient Instructions (Addendum)
Please stop: - Metformin  Continue: - Jardiance 10 mg daily before breakfast - Ozempic 0.5 mg weekly in a.m.   Please return in 6 mo with your sugar log.

## 2022-05-31 NOTE — Progress Notes (Signed)
Patient ID: Joshua Phillips, male   DOB: 10/22/1960, 61 y.o.   MRN: [id number]   HPI: Joshua Phillips is a 61 y.o.-year-old male, initially referred by his cardiologist, Dr. Jacinto Halim, returning for follow-up for DM2, dx in 06/2019, non-insulin-dependent, uncontrolled, with complications (CAD s/p stent 01/2010).  Last visit 7 months ago.  Interim history: No increased urination, blurry vision, nausea, chest pain. He just returned from his detox camp in [place] 2 weeks ago. He lost ~15 lb. Lost 25 lbs after starting Ozempic. Occasional abdominal pain - no nausea, no recent constipation. He changed dinner >> up to 600 cal. Avoids sugar, dairy, oil. No alcohol in 2.5 years.  Reviewed HbA1c levels: 05/11/2022: HbA1c 5.6% Lab Results  Component Value Date   HGBA1C 6.1 (H) 11/09/2021   HGBA1C 6.0 (A) 06/25/2021   HGBA1C 6.1 (A) 11/20/2020   HGBA1C 6.6 (A) 10/18/2019   HGBA1C 8.3 (H) 06/30/2019   HGBA1C (H) 11/03/2010    5.7 (NOTE)                                                                       According to the ADA Clinical Practice Recommendations for 2011, when HbA1c is used as a screening test:   >=6.5%   Diagnostic of Diabetes Mellitus           (if abnormal result  is confirmed)  5.7-6.4%   Increased risk of developing Diabetes Mellitus  References:Diagnosis and Classification of Diabetes Mellitus,Diabetes Care,2011,34(Suppl 1):S62-S69 and Standards of Medical Care in         Diabetes - 2011,Diabetes Care,2011,34  (Suppl 1):S11-S61.  09/13/2020: HbA1c 6.3%   Pt is on: - Metformin 500 mg 1x a day, with b'fast >> with dinner  - Jardiance 10 mg daily in am - Ozempic 0.25 >> 0.5 mg weekly - started 11/2021  Is checking sugars 1-2 times a day: - am: n/c >> 113-125 >> 90-114 >> 90-100 >> 95-107 >> 74-89 - 2h after b'fast: n/c >> 117-198 >> n/c - before lunch: n/c >> 122 >> n/c - 2h after lunch: n/c >> 139-186 >> n/c >> 120-141 - before dinner: n/c >> 99-128 >> n/c - 2h after dinner: up to 145  >> 120-130, 140 >> 108-120s >> n/c - bedtime: n/c >> 96-166 >> n/c - nighttime: n/c Lowest sugar was 93 >> 90 >> 90 >> 95 >> 74; it is unclear at which level he has hypoglycemia awareness Highest sugar was 198 >> 145 >> 140s >> 120s.  >> 141 Glucometer: Contour Next  Pt's meals are: - Breakfast: tea + millet bread or oatmeal or cereal or eggs (rarely) - Lunch: salad or sandwich - Dinner: veggies + flat breads, chicken/fish/beef; occas rice - Snacks:  + diet soda; nuts; indian snack He drinks bitter mellon juice - 2 days a week.  He feels that this is helping with his blood sugars.  -No CKD, last BUN/creatinine:  05/11/2022: 13/1.24 04/2021 - will send these to me 09/13/2020: 48/1.05, GFR 73, glucose 120 Lab Results  Component Value Date   BUN 11 07/23/2019   BUN 10 06/30/2019   CREATININE 1.06 07/23/2019   CREATININE 1.13 06/30/2019  On lisinopril 20.  -+ HL; last set of lipids: 05/11/2022: 129/149/34.6/65  Lab Results  Component Value Date   CHOL 125 11/09/2021   HDL 32 (L) 11/09/2021   LDLCALC 72 11/09/2021   TRIG 111 11/09/2021   CHOLHDL 4.9 06/30/2019  09/13/2020: 120/199/29/68 On Crestor 5 >> 10 + fish oil 1 g once a day. On ASA 81.  - last eye exam was on January 03, 2022: No DR.   -He denies numbness and tingling in his feet.  Pt has FH of DM in father.  Mother had PMR.  He also has a history of HTN. He has a history of transaminitis but his LFTs normalized 09/2020. He had GERD but this resolved after started C-PAP machine 09/2019 (moderate OSA).  He does 2[place]3 week detox treatments in [place] 1[place]2x a [place] [place] He loses weight and feels better after going to these retreats. During the coronavirus pandemic, he could not exercise, was less active, and ate more >> started to feel poorly.  At that time, he was started on Jardiance by Dr. Jacinto HalimGanji and referred to endocrinology. In 2022, he had cardiac evaluation by Dr. Jacinto HalimGanji - carotid U/S and 2D Echo: normal.     He does yoga for 25 to 30 minutes every morning.  ROS: + see HPI  I reviewed pt's medications, allergies, PMH, social hx, family hx, and changes were documented in the history of present illness. Otherwise, unchanged from my initial visit note.  Past Medical History:  Diagnosis Date   Hypertension    Past Surgical History:  Procedure Laterality Date   CARDIAC CATHETERIZATION     CARDIAC SURGERY     stent placement   Social History   Socioeconomic History   Marital status: Married    Spouse name: Not on file   Number of children: 2   Years of education: Not on file   Highest education level: Not on file  Occupational History   Not on file  Tobacco Use   Smoking status: Former    Packs/day: 0.25    Years: 10.00    Total pack years: 2.50    Types: Cigarettes    Quit date: 1061997    Years since quitting: 26.8   Smokeless tobacco: Never  Vaping Use   Vaping Use: Never used  Substance and Sexual Activity   Alcohol use: Not Currently    Comment: occ   Drug use: No   Sexual activity: Yes  Other Topics Concern   Not on file  Social History Narrative   Not on file   Social Determinants of Health   Financial Resource Strain: Not on file  Food Insecurity: Not on file  Transportation Needs: Not on file  Physical Activity: Not on file  Stress: Not on file  Social Connections: Not on file  Intimate Partner Violence: Not on file   Current Outpatient Medications on File Prior to Visit  Medication Sig Dispense Refill   Cholecalciferol (VITAMIN D-3 PO) Take 1 capsule by mouth daily.     Coenzyme Q10 (CO Q-10 PO) Take 1 capsule by mouth daily.     empagliflozin (JARDIANCE) 10 MG TABS tablet Take 1 tablet (10 mg total) by mouth daily before breakfast. 90 tablet 0   glucose blood (CONTOUR NEXT TEST) test strip USE  STRIP TO CHECK GLUCOSE AS DIRECTED ONCE DAILY 100 each 3   lisinopril-hydrochlorothiazide (ZESTORETIC) 20-12.5 MG tablet Take 1 tablet by mouth every morning.  90 tablet 0   lisinopril-hydrochlorothiazide (ZESTORETIC) 20-12.5 MG tablet Take 1 tablet by mouth every morning. 90 tablet  0   metFORMIN (GLUCOPHAGE) 500 MG tablet TAKE 1 TABLET BY MOUTH ONCE DAILY WITH SUPPER 90 tablet 1   metoprolol succinate (TOPROL-XL) 25 MG 24 hr tablet Take 1 tablet (25 mg total) by mouth daily. 90 tablet 3   nitroGLYCERIN (NITROSTAT) 0.4 MG SL tablet Place 1 tablet (0.4 mg total) under the tongue every 5 (five) minutes as needed for chest pain. 25 tablet 0   Omega-3 Fatty Acids (FISH OIL PO) Take 1 capsule by mouth daily.     rosuvastatin (CRESTOR) 10 MG tablet Take 1 tablet (10 mg total) by mouth daily. 90 tablet 0   Semaglutide,0.25 or 0.5MG /DOS, (OZEMPIC, 0.25 OR 0.5 MG/DOSE,) 2 MG/1.5ML SOPN Inject 0.5 mg into the skin once a week. 4.5 mL 5   ticagrelor (BRILINTA) 60 MG TABS tablet Take 1 tablet (60 mg total) by mouth 2 (two) times daily. 180 tablet 0   No current facility-administered medications on file prior to visit.   No Known Allergies Family History  Problem Relation Age of Onset   Hypertension Mother    Stroke Mother    Heart attack Father    Diabetes Father    PE: BP 108/62 (BP Location: Right Arm, Patient Position: Sitting, Cuff Size: Normal)   Pulse 64   Ht 5\' 8"  (1.727 m)   Wt 190 lb 3.2 oz (86.3 kg)   SpO2 94%   BMI 28.92 kg/m  Wt Readings from Last 3 Encounters:  05/31/22 190 lb 3.2 oz (86.3 kg)  11/15/21 214 lb 12.8 oz (97.4 kg)  11/08/21 214 lb 12.8 oz (97.4 kg)   Constitutional: overweight, in NAD Eyes:  EOMI, no exophthalmos ENT: no neck masses, no cervical lymphadenopathy Cardiovascular: RRR, No MRG Respiratory: CTA B Musculoskeletal: no deformities Skin:no rashes Neurological: no tremor with outstretched hands Diabetic Foot Exam - Simple   Simple Foot Form Diabetic Foot exam was performed with the following findings: Yes 05/31/2022  9:58 AM  Visual Inspection No deformities, no ulcerations, no other skin breakdown  bilaterally: Yes Sensation Testing Intact to touch and monofilament testing bilaterally: Yes Pulse Check Posterior Tibialis and Dorsalis pulse intact bilaterally: Yes Comments    ASSESSMENT: 1. DM2, non-insulin-dependent, uncontrolled, with complications - CAD, s/p stent 2011 - Dr. 2012  -He has no family history of medullary thyroid cancer or multiple endocrine neoplasia and no personal history of pancreatitis.  PLAN:  1. Patient with longstanding, previously uncontrolled type 2 diabetes, with improved control, on oral antidiabetic regimen with metformin and SGLT2 inhibitor and also weekly GLP-1 receptor agonist added at last visit.  At that time, HbA1c was excellent, at 6.1%.  He wanted to try a GLP-1 receptor agonist after discussion with his cardiologist, to be able to help with weight loss.  We started at a low dose and increase to 0.5 mg weekly.  This dose was recently approved by his insurance, with a PA. - at today's visit, sugars are all at goal. He is doing a great job with his diet - lost a significant amount of weight on Ozempic. He tolerates it well, with only occasional gas pains. For now, will stop Metformin and continue the rest of the regimen. - I suggested to:  Patient Instructions  Please stop: - Metformin  Continue: - Jardiance 10 mg daily before breakfast - Ozempic 0.5 mg weekly in a.m.   Please return in 6 mo with your sugar log.  - we checked his HbA1c: 5.3% (lower) - advised to check sugars at different times  of the day - 1x a day, rotating check times - advised for yearly eye exams >> he is UTD - return to clinic in 6 months  2. HL -Reviewed latest lipid panel from 10/2021: Fractions at goal with the exception of a low HDL: Lab Results  Component Value Date   CHOL 125 11/09/2021   HDL 32 (L) 11/09/2021   LDLCALC 72 11/09/2021   TRIG 111 11/09/2021   CHOLHDL 4.9 06/30/2019  -He continues on Crestor 10 mg daily and fish oil 1000 mg daily without side  effects  3.  Obesity class I -We will continue his Jardiance and Ozempic which should both help with weight loss -He gained 3 pounds before last visit - since then, he lost ~15 lbs per our scale, approx. 25 per his  Carlus Pavlov, MD PhD [facility] Endocr[facility]

## 2022-06-24 ENCOUNTER — Other Ambulatory Visit: Payer: Self-pay | Admitting: Cardiology

## 2022-06-25 MED ORDER — LISINOPRIL-HYDROCHLOROTHIAZIDE 20-12.5 MG PO TABS: 1.0000 | ORAL_TABLET | Freq: Every morning | ORAL | 3 refills | Status: DC

## 2022-06-26 ENCOUNTER — Ambulatory Visit: Payer: BC Managed Care – PPO | Admitting: Internal Medicine

## 2022-07-01 DIAGNOSIS — M25561 Pain in right knee: Secondary | ICD-10-CM | POA: Diagnosis not present

## 2022-07-01 DIAGNOSIS — M25461 Effusion, right knee: Secondary | ICD-10-CM | POA: Diagnosis not present

## 2022-07-02 DIAGNOSIS — M25561 Pain in right knee: Secondary | ICD-10-CM | POA: Diagnosis not present

## 2022-07-18 DIAGNOSIS — M25561 Pain in right knee: Secondary | ICD-10-CM | POA: Diagnosis not present

## 2022-07-18 DIAGNOSIS — S83241S Other tear of medial meniscus, current injury, right knee, sequela: Secondary | ICD-10-CM | POA: Diagnosis not present

## 2022-08-07 ENCOUNTER — Other Ambulatory Visit: Payer: Self-pay | Admitting: Cardiology

## 2022-08-23 ENCOUNTER — Other Ambulatory Visit: Payer: Self-pay | Admitting: Cardiology

## 2022-08-23 DIAGNOSIS — E785 Hyperlipidemia, unspecified: Secondary | ICD-10-CM

## 2022-08-25 ENCOUNTER — Encounter: Payer: Self-pay | Admitting: Cardiology

## 2022-08-29 ENCOUNTER — Other Ambulatory Visit: Payer: Self-pay | Admitting: Internal Medicine

## 2022-09-05 ENCOUNTER — Encounter: Payer: Self-pay | Admitting: Cardiology

## 2022-10-11 ENCOUNTER — Other Ambulatory Visit: Payer: Self-pay | Admitting: Cardiology

## 2022-10-11 ENCOUNTER — Encounter: Payer: Self-pay | Admitting: Internal Medicine

## 2022-10-11 MED ORDER — METOPROLOL SUCCINATE ER 25 MG PO TB24: 25.0000 mg | ORAL_TABLET | Freq: Every day | ORAL | 3 refills | Status: DC

## 2022-11-08 ENCOUNTER — Ambulatory Visit: Payer: BC Managed Care – PPO | Admitting: Cardiology

## 2022-11-13 ENCOUNTER — Telehealth: Payer: Self-pay

## 2022-11-13 NOTE — Telephone Encounter (Signed)
Patient Advocate Encounter   Received notification from FAX that prior authorization is required for Ozempic  Submitted: 11/13/22 Key [id number]  Status is pending

## 2022-11-18 NOTE — Telephone Encounter (Signed)
Pharmacy Patient Advocate Encounter  Prior Authorization has been approved  Effective dates: 11/16/22 through 11/16/23

## 2022-11-22 ENCOUNTER — Other Ambulatory Visit: Payer: Self-pay | Admitting: Cardiology

## 2022-11-22 DIAGNOSIS — E785 Hyperlipidemia, unspecified: Secondary | ICD-10-CM

## 2022-11-25 DIAGNOSIS — Y999 Unspecified external cause status: Secondary | ICD-10-CM | POA: Diagnosis not present

## 2022-11-25 DIAGNOSIS — S83231A Complex tear of medial meniscus, current injury, right knee, initial encounter: Secondary | ICD-10-CM | POA: Diagnosis not present

## 2022-11-25 DIAGNOSIS — G8918 Other acute postprocedural pain: Secondary | ICD-10-CM | POA: Diagnosis not present

## 2022-11-25 DIAGNOSIS — M94261 Chondromalacia, right knee: Secondary | ICD-10-CM | POA: Diagnosis not present

## 2022-11-25 DIAGNOSIS — X58XXXA Exposure to other specified factors, initial encounter: Secondary | ICD-10-CM | POA: Diagnosis not present

## 2022-11-25 HISTORY — PX: KNEE ARTHROSCOPY: SUR90

## 2022-11-29 ENCOUNTER — Ambulatory Visit: Payer: BC Managed Care – PPO | Admitting: Internal Medicine

## 2022-12-02 DIAGNOSIS — M25561 Pain in right knee: Secondary | ICD-10-CM | POA: Diagnosis not present

## 2022-12-02 DIAGNOSIS — M25661 Stiffness of right knee, not elsewhere classified: Secondary | ICD-10-CM | POA: Diagnosis not present

## 2022-12-04 ENCOUNTER — Ambulatory Visit: Payer: BC Managed Care – PPO | Admitting: Cardiology

## 2022-12-04 NOTE — Progress Notes (Deleted)
Primary Physician/Referring:  Deatra James, MD  Patient ID: Joshua Phillips, male    DOB: 03-03-1961, 62 y.o.   MRN: [id number]  No chief complaint on file.  HPI:    Joshua Phillips  is a 62 y.o. Asian [place] male with CAD S/P stenting to his distal right coronary artery in July 2011 for exertional angina pectoris with resolution of symptoms, hypertension, hyperlipidemia, newly diagnosed diabetes mellitus and moderate to severe obstructive sleep apnea diagnosed in February 2021 and has been compliant with CPAP presents for 12 month follow-up visit.  He is presently doing well and has been compliant with CPAP.  Previously he had elevated LFTs, but has quit drinking alcohol with normalization almost a year ago now.  He has had a few episodes of left-sided neck discomfort and facial numbness, and is worried about angina pectoris.  No headache, no visual disturbances.  No neurologic deficits.  He also felt tingling and numbness in his hands at the same time.   Past Medical History:  Diagnosis Date   Hypertension    Past Surgical History:  Procedure Laterality Date   CARDIAC CATHETERIZATION     CARDIAC SURGERY     stent placement   Family History  Problem Relation Age of Onset   Hypertension Mother    Stroke Mother    Heart attack Father    Diabetes Father     Social History   Tobacco Use   Smoking status: Former    Packs/day: 0.25    Years: 10.00    Additional pack years: 0.00    Total pack years: 2.50    Types: Cigarettes    Quit date: 1997    Years since quitting: 27.4   Smokeless tobacco: Never  Substance Use Topics   Alcohol use: Not Currently    Comment: occ   Marital Status: Married  ROS  Review of Systems  Cardiovascular:  Negative for chest pain, dyspnea on exertion and leg swelling.  Respiratory:  Positive for snoring (OSA on CPAP).   Gastrointestinal:  Negative for melena.   Objective  There were no vitals taken for this visit.     05/31/2022    9:47 AM  11/15/2021    1:08 PM 11/08/2021    9:37 AM  Vitals with BMI  Height 5\' 8"  5\' 8"  5\' 8"   Weight 190 lbs 3 oz 214 lbs 13 oz 214 lbs 13 oz  BMI 28.93 32.67 32.67  Systolic 108 120 782  Diastolic 62 82 74  Pulse 64 61 63     Physical Exam Neck:     Vascular: No JVD.  Cardiovascular:     Rate and Rhythm: Normal rate and regular rhythm.     Pulses: Intact distal pulses.     Heart sounds: Normal heart sounds. No murmur heard.    No gallop.  Pulmonary:     Effort: Pulmonary effort is normal.     Breath sounds: Normal breath sounds.  Abdominal:     General: Bowel sounds are normal.     Palpations: Abdomen is soft.  Musculoskeletal:     Right lower leg: No edema.     Left lower leg: No edema.    Laboratory examination:   External labs:   Cholesterol, total 125.000 m 11/09/2021 HDL 32.000 mg 11/09/2021 LDL 72.000 mg 11/09/2021 Triglycerides 111.000 m 11/09/2021  A1C 5.300 % 05/31/2022  Labs 09/13/2020:  PSA normal at 0.33, creatinine  LFTs normal.  Serum glucose 120 mg, BUN  14, creatinine 1.05, EGFR 73 mL.  A1c 6.3%.  T. Chol 120, triglycerides 199, HDL 29, LDL 58.  Non-HDL cholesterol 91.   Radiology:   CT Angio Chest with Contrast 09/27/2016: Areas of coronary artery calcification.  Cardiac Studies:   PTCA and stent to distal RCA 02/02/2010 2.5x18 mm Promus DES. Coronary angio 11/14/2010: Mild diffuse CAD of all three vessels. Stent patent. Ramus intermediate small with mid 90% stenosis.  Home Sleep Study 09/02/2019: Moderate -severe obstructive sleep apnea at AHI of 25.8/h with accentuation in REM sleep to 31.2/h. There is a trend to bradycardia noted, no hypoxemia events were correlated. CPAP recommended.   PCV MYOCARDIAL PERFUSION WO LEXISCAN 11/26/2021  Narrative Exercise nuclear stress test 11/26/2021: Normal ECG stress. Resting EKG demonstrated normal sinus rhythm. Peak EKG revealed < 1mm  non-specific ST depression, resolved immediately into recovery with no  recurrence. The patient exercised for 8 minutes and 10 seconds of a Bruce protocol, achieving approximately 10.16 METs.  The blood pressure response was normal. Myocardial perfusion is abnormal. There is a very small moderate reversible defect in the apical region and a very small reversible mild defect in the septal region (LAD and RCA). Overall LV systolic function is normal without regional wall motion abnormalities. Stress LV EF: 64%. No previous exam available for comparison. Low risk.   PCV ECHOCARDIOGRAM COMPLETE 11/14/2021  Narrative Echocardiogram 11/14/2021: Normal LV systolic function with visual EF 60-65%. Left ventricle cavity is normal in size. Normal left ventricular wall thickness. Normal global wall motion. Normal diastolic filling pattern, normal LAP. Mild (Grade I) mitral regurgitation. Mild tricuspid regurgitation. No evidence of pulmonary hypertension. No prior study for comparison.     EKG   *** EKG 11/06/2020: Normal sinus rhythm at rate of 65 bpm, normal axis.  No evidence of ischemia, normal EKG. No significant change from 07/02/2019    Medications and allergies  No Known Allergies  Current Outpatient Medications:    Cholecalciferol (VITAMIN D-3 PO), Take 1 capsule by mouth daily., Disp: , Rfl:    Coenzyme Q10 (CO Q-10 PO), Take 1 capsule by mouth daily., Disp: , Rfl:    empagliflozin (JARDIANCE) 10 MG TABS tablet, Take 1 tablet (10 mg total) by mouth daily before breakfast., Disp: 90 tablet, Rfl: 3   glucose blood (CONTOUR NEXT TEST) test strip, USE  STRIP TO CHECK GLUCOSE ONCE DAILY, Disp: 100 each, Rfl: 0   lisinopril-hydrochlorothiazide (ZESTORETIC) 20-12.5 MG tablet, Take 1 tablet by mouth every morning., Disp: 90 tablet, Rfl: 0   lisinopril-hydrochlorothiazide (ZESTORETIC) 20-12.5 MG tablet, Take 1 tablet by mouth every morning., Disp: 90 tablet, Rfl: 3   metoprolol succinate (TOPROL XL) 25 MG 24 hr tablet, Take 1 tablet (25 mg total) by mouth daily.,  Disp: 90 tablet, Rfl: 3   metoprolol succinate (TOPROL-XL) 25 MG 24 hr tablet, Take 1 tablet by mouth once daily, Disp: 90 tablet, Rfl: 0   nitroGLYCERIN (NITROSTAT) 0.4 MG SL tablet, Place 1 tablet (0.4 mg total) under the tongue every 5 (five) minutes as needed for chest pain., Disp: 25 tablet, Rfl: 0   Omega-3 Fatty Acids (FISH OIL PO), Take 1 capsule by mouth daily., Disp: , Rfl:    rosuvastatin (CRESTOR) 10 MG tablet, Take 1 tablet by mouth once daily, Disp: 90 tablet, Rfl: 3   Semaglutide,0.25 or 0.5MG /DOS, (OZEMPIC, 0.25 OR 0.5 MG/DOSE,) 2 MG/1.5ML SOPN, Inject 0.5 mg into the skin once a week., Disp: 4.5 mL, Rfl: 5   ticagrelor (BRILINTA) 60 MG TABS tablet,  Take 1 tablet by mouth twice daily, Disp: 180 tablet, Rfl: 1   Assessment   No diagnosis found.   No orders of the defined types were placed in this encounter.   There are no discontinued medications.  Recommendations:   Joshua Phillips  is a 62 y.o. Asian [place] male with CAD S/P stenting to his distal right coronary artery in July 2011 for exertional angina pectoris with resolution of symptoms, hypertension, hyperlipidemia, newly diagnosed diabetes mellitus and moderate to severe obstructive sleep apnea diagnosed in February 2021 and has been compliant with CPAP.  He has had a few episodes of left facial and left neck discomfort, could be anginal equivalent.  He has not had any cardiac testing in many years, we will schedule him for an echocardiogram and also exercise nuclear stress test.  Blood pressure is well controlled, lipids are also well controlled but needs annual labs.  He does have hypovitaminosis D and I will check the level as well.  Weight loss again discussed with the patient.  I will also message Dr. Ernest Haber further [phone] or Victoza may be a good choice for his diabetes and also potential weight loss.   Yates Decamp, MD, [facility] 12/04/2022, 7:45 AM Office: [phone] Pager: [phone]

## 2022-12-05 ENCOUNTER — Ambulatory Visit: Payer: BC Managed Care – PPO | Admitting: Internal Medicine

## 2022-12-05 ENCOUNTER — Encounter: Payer: Self-pay | Admitting: Internal Medicine

## 2022-12-05 VITALS — BP 122/80 | HR 69 | Ht 68.0 in | Wt 194.0 lb

## 2022-12-05 DIAGNOSIS — Z7984 Long term (current) use of oral hypoglycemic drugs: Secondary | ICD-10-CM | POA: Diagnosis not present

## 2022-12-05 DIAGNOSIS — E6609 Other obesity due to excess calories: Secondary | ICD-10-CM | POA: Diagnosis not present

## 2022-12-05 DIAGNOSIS — Z6834 Body mass index (BMI) 34.0-34.9, adult: Secondary | ICD-10-CM | POA: Diagnosis not present

## 2022-12-05 DIAGNOSIS — E1165 Type 2 diabetes mellitus with hyperglycemia: Secondary | ICD-10-CM

## 2022-12-05 DIAGNOSIS — M25561 Pain in right knee: Secondary | ICD-10-CM | POA: Diagnosis not present

## 2022-12-05 DIAGNOSIS — E119 Type 2 diabetes mellitus without complications: Secondary | ICD-10-CM

## 2022-12-05 DIAGNOSIS — E785 Hyperlipidemia, unspecified: Secondary | ICD-10-CM

## 2022-12-05 DIAGNOSIS — M25661 Stiffness of right knee, not elsewhere classified: Secondary | ICD-10-CM | POA: Diagnosis not present

## 2022-12-05 LAB — POCT GLYCOSYLATED HEMOGLOBIN (HGB A1C): Hemoglobin A1C: 5.6 % (ref 4.0–5.6)

## 2022-12-05 MED ORDER — SEMAGLUTIDE(0.25 OR 0.5MG/DOS) 2 MG/3ML ~~LOC~~ SOPN: 0.5000 mg | PEN_INJECTOR | SUBCUTANEOUS | 3 refills | Status: DC

## 2022-12-05 NOTE — Progress Notes (Signed)
Patient ID: Joshua Phillips, male   DOB: June 03, 1961, 62 y.o.   MRN: [id number]   HPI: Joshua BRIETZKE is a 62 y.o.-year-old male, initially referred by his cardiologist, Dr. Jacinto Halim, returning for follow-up for DM2, dx in 06/2019, non-insulin-dependent, uncontrolled, with complications (CAD s/p stent 01/2010).  Last visit 7 months ago.  Interim history: No increased urination, blurry vision, nausea, chest pain.   Before last visit, adjusted his diet: Lighter dinner-up to 600 cal. Avoids sugar, dairy, oil. No alcohol in 3 years. In 06/2023 she developed a right meniscal tear. He had surgery last week. In PT now. He was off Ozempic before the Sx. He did not start yet but plans to do so soon.  He is preparing to go to the [facility] on [place].  Reviewed HbA1c levels: 05/11/2022: HbA1c 5.6% Lab Results  Component Value Date   HGBA1C 5.3 05/31/2022   HGBA1C 6.1 (H) 11/09/2021   HGBA1C 6.0 (A) 06/25/2021   HGBA1C 6.1 (A) 11/20/2020   HGBA1C 6.6 (A) 10/18/2019   HGBA1C 8.3 (H) 06/30/2019   HGBA1C (H) 11/03/2010    5.7 (NOTE)                                                                       According to the ADA Clinical Practice Recommendations for 2011, when HbA1c is used as a screening test:   >=6.5%   Diagnostic of Diabetes Mellitus           (if abnormal result  is confirmed)  5.7-6.4%   Increased risk of developing Diabetes Mellitus  References:Diagnosis and Classification of Diabetes Mellitus,Diabetes Care,2011,34(Suppl 1):S62-S69 and Standards of Medical Care in         Diabetes - 2011,Diabetes Care,2011,34  (Suppl 1):S11-S61.  09/13/2020: HbA1c 6.3%   Pt is on: - Jardiance 10 mg daily in am - Ozempic 0.25 >> 0.5 mg weekly - off now We stopped metformin 05/2022.  He was taking the minimum dose.  Is checking sugars 1-2 times a day: - am: n/c >> 113-125 >> 90-114 >> 90-100 >> 95-107 >> 74-89 >> 94-97 - 2h after b'fast: n/c >> 117-198 >> n/c - before lunch: n/c >> 122 >> n/c - 2h after  lunch: n/c >> 139-186 >> n/c >> 120-141 >> 120-130 (off Ozempic) - before dinner: n/c >> 99-128 >> n/c - 2h after dinner: up to 145 >> 120-130, 140 >> 108-120s >> n/c  >> same as above - bedtime: n/c >> 96-166 >> n/c - nighttime: n/c Lowest sugar was 93 >> 90 >> 90 >> 95 >> 74 >> 90; it is unclear at which level he has hypoglycemia awareness Highest sugar was 198 >> 145 >> 140s >> 120s >> 137.  >> 141 Glucometer: Contour Next  Pt's meals are: - Breakfast: tea + millet bread or oatmeal or cereal or eggs (rarely) - Lunch: salad or sandwich - Dinner: veggies + flat breads, chicken/fish/beef; occas rice - Snacks:  + diet soda; nuts; indian snack He drinks bitter mellon juice - 2 days a week.  He feels that this is helping with his blood sugars.  -No CKD, last BUN/creatinine:  05/11/2022: 13/1.24 04/2021 - will send these to me 09/13/2020: 48/1.05, GFR 73, glucose 120 Lab  Results  Component Value Date   BUN 11 07/23/2019   BUN 10 06/30/2019   CREATININE 1.06 07/23/2019   CREATININE 1.13 06/30/2019  On lisinopril 20.  -+ HL; last set of lipids: 05/11/2022: 129/149/34.6/65 Lab Results  Component Value Date   CHOL 125 11/09/2021   HDL 32 (L) 11/09/2021   LDLCALC 72 11/09/2021   TRIG 111 11/09/2021   CHOLHDL 4.9 06/30/2019  09/13/2020: 120/199/29/68 On Crestor 5 >> 10 + fish oil 1 g once a day. On ASA 81.  - last eye exam was on 01/03/2022: No DR.   -He denies numbness and tingling in his feet.  Last foot exam was in 05/2022 here in clinic.  Pt has FH of DM in father.  Mother had PMR.  He also has a history of HTN. He has a history of transaminitis but his LFTs normalized 09/2020. He had GERD but this resolved after started C-PAP machine 09/2019 (moderate OSA).  He does 2-3 week detox treatments in [place] 1-2x a year ([facility]). He loses weight and feels better after going to these retreats. During the coronavirus pandemic, he could not exercise, was less active, and ate  more >> started to feel poorly.  At that time, he was started on Jardiance by Dr. Jacinto Halim and referred to endocrinology. In 2022, he had cardiac evaluation by Dr. Jacinto Halim - carotid U/S and 2D Echo: normal.    He does yoga for 25 to 30 minutes every morning.  ROS: + see HPI  I reviewed pt's medications, allergies, PMH, social hx, family hx, and changes were documented in the history of present illness. Otherwise, unchanged from my initial visit note.  Past Medical History:  Diagnosis Date   Hypertension    Past Surgical History:  Procedure Laterality Date   CARDIAC CATHETERIZATION     CARDIAC SURGERY     stent placement   Social History   Socioeconomic History   Marital status: Married    Spouse name: Not on file   Number of children: 2   Years of education: Not on file   Highest education level: Not on file  Occupational History   Not on file  Tobacco Use   Smoking status: Former    Packs/day: 0.25    Years: 10.00    Additional pack years: 0.00    Total pack years: 2.50    Types: Cigarettes    Quit date: 79    Years since quitting: 27.4   Smokeless tobacco: Never  Vaping Use   Vaping Use: Never used  Substance and Sexual Activity   Alcohol use: Not Currently    Comment: occ   Drug use: No   Sexual activity: Yes  Other Topics Concern   Not on file  Social History Narrative   Not on file   Social Determinants of Health   Financial Resource Strain: Not on file  Food Insecurity: Not on file  Transportation Needs: Not on file  Physical Activity: Not on file  Stress: Not on file  Social Connections: Not on file  Intimate Partner Violence: Not on file   Current Outpatient Medications on File Prior to Visit  Medication Sig Dispense Refill   Cholecalciferol (VITAMIN D-3 PO) Take 1 capsule by mouth daily.     Coenzyme Q10 (CO Q-10 PO) Take 1 capsule by mouth daily.     empagliflozin (JARDIANCE) 10 MG TABS tablet Take 1 tablet (10 mg total) by mouth daily before  breakfast. 90 tablet 3  glucose blood (CONTOUR NEXT TEST) test strip USE  STRIP TO CHECK GLUCOSE ONCE DAILY 100 each 0   lisinopril-hydrochlorothiazide (ZESTORETIC) 20-12.5 MG tablet Take 1 tablet by mouth every morning. 90 tablet 0   lisinopril-hydrochlorothiazide (ZESTORETIC) 20-12.5 MG tablet Take 1 tablet by mouth every morning. 90 tablet 3   metoprolol succinate (TOPROL XL) 25 MG 24 hr tablet Take 1 tablet (25 mg total) by mouth daily. 90 tablet 3   metoprolol succinate (TOPROL-XL) 25 MG 24 hr tablet Take 1 tablet by mouth once daily 90 tablet 0   nitroGLYCERIN (NITROSTAT) 0.4 MG SL tablet Place 1 tablet (0.4 mg total) under the tongue every 5 (five) minutes as needed for chest pain. 25 tablet 0   Omega-3 Fatty Acids (FISH OIL PO) Take 1 capsule by mouth daily.     rosuvastatin (CRESTOR) 10 MG tablet Take 1 tablet by mouth once daily 90 tablet 3   Semaglutide,0.25 or 0.5MG /DOS, (OZEMPIC, 0.25 OR 0.5 MG/DOSE,) 2 MG/1.5ML SOPN Inject 0.5 mg into the skin once a week. 4.5 mL 5   ticagrelor (BRILINTA) 60 MG TABS tablet Take 1 tablet by mouth twice daily 180 tablet 1   No current facility-administered medications on file prior to visit.   No Known Allergies Family History  Problem Relation Age of Onset   Hypertension Mother    Stroke Mother    Heart attack Father    Diabetes Father    PE: BP 122/80 (BP Location: Right Arm, Patient Position: Sitting, Cuff Size: Normal)   Pulse 69   Ht 5\' 8"  (1.727 m)   Wt 194 lb (88 kg)   SpO2 97%   BMI 29.50 kg/m  Wt Readings from Last 3 Encounters:  12/05/22 194 lb (88 kg)  05/31/22 190 lb 3.2 oz (86.3 kg)  11/15/21 214 lb 12.8 oz (97.4 kg)   Constitutional: overweight, in NAD, antalgic gait Eyes:  EOMI, no exophthalmos ENT: no neck masses, no cervical lymphadenopathy Cardiovascular: RRR, No MRG Respiratory: CTA B Musculoskeletal: no deformities Skin:no rashes Neurological: no tremor with outstretched hands  ASSESSMENT: 1. DM2,  non-insulin-dependent, uncontrolled, with complications - CAD, s/p stent 2011 - Dr. Jacinto Halim  -He has no family history of medullary thyroid cancer or multiple endocrine neoplasia and no personal history of pancreatitis.  PLAN:  1. Patient with longstanding, previously uncontrolled type 1 diabetes, with improved control on po antidiabetic medication with SGLT2 inhibitor and also on injectable weekly GLP-1 receptor agonist.  At last visit, sugars were all at goal.  He was doing a great job with his diet and lost a significant amount of weight on Ozempic.  We stopped metformin and continue to the rest of the regimen.  HbA1c was excellent, at 5.3%, decreased. -At today's visit, sugars are all at goal, despite having been off his medications in preparation for surgery.  Jardiance he just restarted, but he did not restart Ozempic yet.  He will do so soon.  For now, there is no need to change his regimen.  I refilled his Ozempic.  He is wondering about reducing the dose of Jardiance but I recommended against it for better cardiovascular and renal outcomes. - I suggested to:  Patient Instructions  Please continue: - Jardiance 10 mg daily before breakfast - Ozempic 0.5 mg weekly in a.m.   Please return in 6 mo with your sugar log.  - we checked his HbA1c: 5.6% (slightly higher) - advised to check sugars at different times of the day - 1x a day,  rotating check times - advised for yearly eye exams >> he is UTD - return to clinic in 6 months  2. HL -Reviewed the lipid panel from 05/11/2022: 129/149/34.6/65: HDL slightly low and LDL above our target of less than 55 due to history cardiovascular disease -He continues Crestor 10 mg daily and fish oil 1000 mg daily without side effects  3.  Obesity class I -We will continue his Jardiance and Ozempic which should both help with weight loss -Before last visit, he lost approximately 25 pounds -Since last visit, he only gained 4 pounds, despite having been  traveling including a cruise and also being off his medications in preparation for his knee surgery.  Carlus Pavlov, MD PhD [facility]

## 2022-12-05 NOTE — Patient Instructions (Signed)
Please continue: - Jardiance 10 mg daily before breakfast - Ozempic 0.5 mg weekly in a.m.   Please return in 6 months.

## 2022-12-11 DIAGNOSIS — M25661 Stiffness of right knee, not elsewhere classified: Secondary | ICD-10-CM | POA: Diagnosis not present

## 2022-12-13 DIAGNOSIS — M25661 Stiffness of right knee, not elsewhere classified: Secondary | ICD-10-CM | POA: Diagnosis not present

## 2022-12-23 ENCOUNTER — Encounter: Payer: Self-pay | Admitting: Internal Medicine

## 2022-12-23 ENCOUNTER — Other Ambulatory Visit: Payer: Self-pay | Admitting: Internal Medicine

## 2022-12-23 MED ORDER — SEMAGLUTIDE(0.25 OR 0.5MG/DOS) 2 MG/3ML ~~LOC~~ SOPN: 0.5000 mg | PEN_INJECTOR | SUBCUTANEOUS | 3 refills | Status: DC

## 2022-12-24 DIAGNOSIS — M25661 Stiffness of right knee, not elsewhere classified: Secondary | ICD-10-CM | POA: Diagnosis not present

## 2022-12-24 DIAGNOSIS — M25561 Pain in right knee: Secondary | ICD-10-CM | POA: Diagnosis not present

## 2022-12-31 ENCOUNTER — Ambulatory Visit: Payer: BC Managed Care – PPO | Admitting: Cardiology

## 2022-12-31 ENCOUNTER — Encounter: Payer: Self-pay | Admitting: Cardiology

## 2022-12-31 VITALS — BP 102/76 | HR 66 | Ht 68.0 in | Wt 193.0 lb

## 2022-12-31 DIAGNOSIS — I10 Essential (primary) hypertension: Secondary | ICD-10-CM

## 2022-12-31 DIAGNOSIS — E785 Hyperlipidemia, unspecified: Secondary | ICD-10-CM | POA: Diagnosis not present

## 2022-12-31 DIAGNOSIS — I25118 Atherosclerotic heart disease of native coronary artery with other forms of angina pectoris: Secondary | ICD-10-CM | POA: Diagnosis not present

## 2022-12-31 DIAGNOSIS — E119 Type 2 diabetes mellitus without complications: Secondary | ICD-10-CM | POA: Diagnosis not present

## 2022-12-31 MED ORDER — CLOPIDOGREL BISULFATE 75 MG PO TABS
75.0000 mg | ORAL_TABLET | Freq: Every day | ORAL | 3 refills | Status: DC
Start: 2022-12-31 — End: 2023-11-27

## 2022-12-31 NOTE — Progress Notes (Unsigned)
Primary Physician/Referring:  Deatra James, MD  Patient ID: Joshua Phillips, male    DOB: 10-05-1960, 62 y.o.   MRN: [id number]  Chief Complaint  Patient presents with   Coronary Artery Disease   Follow-up   Hyperlipidemia   Hypertension    HPI:    Joshua Phillips  is a 62 y.o. Asian [place] male with CAD S/P stenting to his distal right coronary artery in July 2011 for exertional angina pectoris with resolution of symptoms, hypertension, hyperlipidemia, newly diagnosed diabetes mellitus and moderate to severe obstructive sleep apnea diagnosed in February 2021, presents for 12 month follow-up visit.  He has made significant lifestyle changes, has completely quit drinking alcohol, has reduced meat intake and has been on Ozempic and has helped him with weight loss as well.  He is asymptomatic.  He has discontinued using CPAP with weight loss and states that he is sleeping well and does not feel fatigued, tiredness or headaches when he wakes up in the morning.  His wife also notices that he has not been snoring.   Past Medical History:  Diagnosis Date   Hypertension    Past Surgical History:  Procedure Laterality Date   CARDIAC CATHETERIZATION     CARDIAC SURGERY     stent placement   KNEE ARTHROSCOPY Right 11/25/2022   Family History  Problem Relation Age of Onset   Hypertension Mother    Stroke Mother    Heart attack Father    Diabetes Father     Social History   Tobacco Use   Smoking status: Former    Packs/day: 0.25    Years: 10.00    Additional pack years: 0.00    Total pack years: 2.50    Types: Cigarettes    Quit date: 1997    Years since quitting: 27.4   Smokeless tobacco: Never  Substance Use Topics   Alcohol use: Not Currently    Comment: occ   Marital Status: Married  ROS  Review of Systems  Cardiovascular:  Negative for chest pain, dyspnea on exertion and leg swelling.  Gastrointestinal:  Negative for melena.   Objective  Blood pressure 102/76,  pulse 66, height 5\' 8"  (1.727 m), weight 193 lb (87.5 kg), SpO2 97 %.     12/31/2022   11:01 AM 12/05/2022    3:01 PM 05/31/2022    9:47 AM  Vitals with BMI  Height 5\' 8"  5\' 8"  5\' 8"   Weight 193 lbs 194 lbs 190 lbs 3 oz  BMI 29.35 29.5 28.93  Systolic 102 122 409  Diastolic 76 80 62  Pulse 66 69 64     Physical Exam Neck:     Vascular: No JVD.  Cardiovascular:     Rate and Rhythm: Normal rate and regular rhythm.     Pulses: Intact distal pulses.     Heart sounds: Normal heart sounds. No murmur heard.    No gallop.  Pulmonary:     Effort: Pulmonary effort is normal.     Breath sounds: Normal breath sounds.  Abdominal:     General: Bowel sounds are normal.     Palpations: Abdomen is soft.  Musculoskeletal:     Right lower leg: No edema.     Left lower leg: No edema.    Laboratory examination:   External labs:   Labs 05/11/2022:  Total cholesterol 129, triglycerides 149, HDL 34, LDL 65.  BUN 27, creatinine 1.24, LFTs normal.  Hb 15.3/HCT 45.7, platelets 252, normal  indicis.  A1c 5.6%.    Cholesterol, total 125.000 m 11/09/2021 HDL 32.000 mg 11/09/2021 LDL 72.000 mg 11/09/2021 Triglycerides 111.000 m 11/09/2021  A1C 5.300 % 05/31/2022   Radiology:   CT Angio Chest with Contrast 09/27/2016: Areas of coronary artery calcification.  Cardiac Studies:   PTCA and stent to distal RCA 02/02/2010 2.5x18 mm Promus DES. Coronary angio 11/14/2010: Mild diffuse CAD of all three vessels. Stent patent. Ramus intermediate small with mid 90% stenosis.  Home Sleep Study 09/02/2019: Moderate -severe obstructive sleep apnea at AHI of 25.8/h with accentuation in REM sleep to 31.2/h. There is a trend to bradycardia noted, no hypoxemia events were correlated. CPAP recommended.   PCV MYOCARDIAL PERFUSION WO LEXISCAN 11/26/2021  Normal ECG stress. Resting EKG demonstrated normal sinus rhythm. Peak EKG revealed < 1mm  non-specific ST depression, resolved immediately into recovery with no  recurrence. The patient exercised for 8 minutes and 10 seconds of a Bruce protocol, achieving approximately 10.16 METs.  The blood pressure response was normal. Myocardial perfusion is abnormal. There is a very small moderate reversible defect in the apical region and a very small reversible mild defect in the septal region (LAD and RCA). Overall LV systolic function is normal without regional wall motion abnormalities. Stress LV EF: 64%. No previous exam available for comparison. Low risk.   PCV ECHOCARDIOGRAM COMPLETE 11/14/2021  Normal LV systolic function with visual EF 60-65%. Left ventricle cavity is normal in size. Normal left ventricular wall thickness. Normal global wall motion. Normal diastolic filling pattern, normal LAP. Mild (Grade I) mitral regurgitation. Mild tricuspid regurgitation. No evidence of pulmonary hypertension. No prior study for comparison.    EKG   EKG 12/31/2022: Normal sinus rhythm at rate of 68 bpm, normal EKG.  Compared to 11/08/2021, no change.   Medications and allergies  No Known Allergies  Current Outpatient Medications:    Cholecalciferol (VITAMIN D-3 PO), Take 1 capsule by mouth daily., Disp: , Rfl:    clopidogrel (PLAVIX) 75 MG tablet, Take 1 tablet (75 mg total) by mouth daily., Disp: 90 tablet, Rfl: 3   Coenzyme Q10 (CO Q-10 PO), Take 1 capsule by mouth daily., Disp: , Rfl:    empagliflozin (JARDIANCE) 10 MG TABS tablet, Take 1 tablet (10 mg total) by mouth daily before breakfast., Disp: 90 tablet, Rfl: 3   lisinopril-hydrochlorothiazide (ZESTORETIC) 20-12.5 MG tablet, Take 1 tablet by mouth every morning., Disp: 90 tablet, Rfl: 0   nitroGLYCERIN (NITROSTAT) 0.4 MG SL tablet, Place 1 tablet (0.4 mg total) under the tongue every 5 (five) minutes as needed for chest pain., Disp: 25 tablet, Rfl: 0   rosuvastatin (CRESTOR) 10 MG tablet, Take 1 tablet by mouth once daily, Disp: 90 tablet, Rfl: 3   Semaglutide,0.25 or 0.5MG /DOS, 2 MG/3ML SOPN, Inject 0.5 mg  into the skin once a week., Disp: 9 mL, Rfl: 3   glucose blood (CONTOUR NEXT TEST) test strip, USE  STRIP TO CHECK GLUCOSE ONCE DAILY, Disp: 100 each, Rfl: 0   Assessment     ICD-10-CM   1. Coronary artery disease of native artery of native heart with stable angina pectoris (HCC)  I25.118 EKG 12-Lead    clopidogrel (PLAVIX) 75 MG tablet    2. Primary hypertension  I10     3. Hyperlipidemia LDL goal <70  E78.5     4. Type 2 diabetes mellitus without complication, without long-term current use of insulin (HCC)  E11.9        Meds ordered this encounter  Medications   clopidogrel (PLAVIX) 75 MG tablet    Sig: Take 1 tablet (75 mg total) by mouth daily.    Dispense:  90 tablet    Refill:  3    Please discontinue metoprolol, Brilinta.    Medications Discontinued During This Encounter  Medication Reason   lisinopril-hydrochlorothiazide (ZESTORETIC) 20-12.5 MG tablet Duplicate   metoprolol succinate (TOPROL XL) 25 MG 24 hr tablet Duplicate   metoprolol succinate (TOPROL-XL) 25 MG 24 hr tablet Completed Course   ticagrelor (BRILINTA) 60 MG TABS tablet Change in therapy   Omega-3 Fatty Acids (FISH OIL PO) Discontinued by provider    Recommendations:   Joshua Phillips  is a 62 y.o. Asian [place] male with CAD S/P stenting to his distal right coronary artery in July 2011 for exertional angina pectoris with resolution of symptoms, hypertension, hyperlipidemia, newly diagnosed diabetes mellitus and moderate to severe obstructive sleep apnea diagnosed in February 2021 and had been compliant with CPAP, however with weight loss he has discontinued using CPAP.  1. Coronary artery disease of native artery of native heart with stable angina pectoris [facility]) Patient is presently doing well and essentially remains asymptomatic and has made significant lifestyle changes.  No recurrence of angina pectoris.  Advised him that he has been on Brilinta for a long time, we could switch over to Plavix 75 mg once  daily, he prefers to be on Plavix versus aspirin. - EKG 12-Lead - clopidogrel (PLAVIX) 75 MG tablet; Take 1 tablet (75 mg total) by mouth daily.  Dispense: 90 tablet; Refill: 3  2. Primary hypertension Blood pressure Controlled.  Continue present medical therapy. Labs reviewed.  Renal function normal.  3. Hyperlipidemia LDL goal <70 Lipids at goal.  No changes to statin therapy.  4. Type 2 diabetes mellitus without complication, without long-term current use of insulin (HCC) Patient has started Ozempic, he is presently doing well and is tolerating this and has lost weight.  On top of that he has made significant lifestyle changes with regard to complete avoidance of alcohol and has also reduced meat intake.  Overall I am very pleased with his progress, I will see him back on annual basis.  Since weight loss, patient states that he has stopped using CPAP.  As his sleep apnea was severe, I would have a low threshold to recheck sleep disordered breathing.    Yates Decamp, MD, [facility] 12/31/2022, 12:24 PM Office: [phone] Pager: [phone]

## 2023-01-01 ENCOUNTER — Other Ambulatory Visit: Payer: Self-pay | Admitting: Cardiology

## 2023-01-01 ENCOUNTER — Encounter: Payer: Self-pay | Admitting: Cardiology

## 2023-01-03 MED ORDER — NITROGLYCERIN 0.4 MG SL SUBL: 0.4000 mg | SUBLINGUAL_TABLET | SUBLINGUAL | 0 refills | Status: DC | PRN

## 2023-01-24 DIAGNOSIS — Z0000 Encounter for general adult medical examination without abnormal findings: Secondary | ICD-10-CM | POA: Diagnosis not present

## 2023-02-01 ENCOUNTER — Other Ambulatory Visit: Payer: Self-pay | Admitting: Cardiology

## 2023-03-02 DIAGNOSIS — J029 Acute pharyngitis, unspecified: Secondary | ICD-10-CM | POA: Diagnosis not present

## 2023-03-02 DIAGNOSIS — R509 Fever, unspecified: Secondary | ICD-10-CM | POA: Diagnosis not present

## 2023-03-02 DIAGNOSIS — U071 COVID-19: Secondary | ICD-10-CM | POA: Diagnosis not present

## 2023-06-02 ENCOUNTER — Ambulatory Visit: Payer: BC Managed Care – PPO | Admitting: Internal Medicine

## 2023-06-02 ENCOUNTER — Encounter: Payer: Self-pay | Admitting: Internal Medicine

## 2023-06-02 VITALS — BP 118/74 | HR 68 | Ht 68.0 in | Wt 182.8 lb

## 2023-06-02 DIAGNOSIS — E785 Hyperlipidemia, unspecified: Secondary | ICD-10-CM | POA: Diagnosis not present

## 2023-06-02 DIAGNOSIS — E1165 Type 2 diabetes mellitus with hyperglycemia: Secondary | ICD-10-CM | POA: Diagnosis not present

## 2023-06-02 DIAGNOSIS — Z7984 Long term (current) use of oral hypoglycemic drugs: Secondary | ICD-10-CM | POA: Diagnosis not present

## 2023-06-02 DIAGNOSIS — Z7985 Long-term (current) use of injectable non-insulin antidiabetic drugs: Secondary | ICD-10-CM

## 2023-06-02 DIAGNOSIS — E66811 Obesity, class 1: Secondary | ICD-10-CM | POA: Diagnosis not present

## 2023-06-02 DIAGNOSIS — Z6834 Body mass index (BMI) 34.0-34.9, adult: Secondary | ICD-10-CM

## 2023-06-02 DIAGNOSIS — E6609 Other obesity due to excess calories: Secondary | ICD-10-CM

## 2023-06-02 LAB — POCT GLYCOSYLATED HEMOGLOBIN (HGB A1C): Hemoglobin A1C: 5.1 % (ref 4.0–5.6)

## 2023-06-02 NOTE — Addendum Note (Signed)
Addended by: Pollie Meyer on: 06/02/2023 02:34 PM   Modules accepted: Orders

## 2023-06-02 NOTE — Progress Notes (Signed)
Patient ID: Joshua Phillips, male   DOB: 1961/02/01, 62 y.o.   MRN: [id number]   HPI: Joshua Phillips is a 62 y.o.-year-old male, initially referred by his cardiologist, Dr. Jacinto Halim, returning for follow-up for DM2, dx in 06/2019, non-insulin-dependent, uncontrolled, with complications (CAD s/p stent 01/2010).  Last visit 6 months ago.  Interim history: No increased urination, blurry vision, nausea, chest pain.  He has constipation  - on Metamucil. In 2023, he started to adjust his diet: Currently eats a lighter dinner-up to 500 cal. Avoids sugar, dairy, oil. No alcohol in 3.5 years. In 06/2023 she developed a right meniscal tear. He had surgery before last visit.  He finished PT. no complaints at today's visit. He started to cut down carbs (rice, bread). He returned from a detox program in [place] 2 mo ago. He was off his diabetes medicines while there. He usually goes twice a year - loses 14-15 lbs there. He came off Toprol, Norvasc, Brilinta. Now Plavix.  Reviewed HbA1c levels:  Lab Results  Component Value Date   HGBA1C 5.6 12/05/2022   HGBA1C 5.3 05/31/2022   HGBA1C 6.1 (H) 11/09/2021   HGBA1C 6.0 (A) 06/25/2021   HGBA1C 6.1 (A) 11/20/2020   HGBA1C 6.6 (A) 10/18/2019   HGBA1C 8.3 (H) 06/30/2019   HGBA1C (H) 11/03/2010    5.7 (NOTE)                                                                       According to the ADA Clinical Practice Recommendations for 2011, when HbA1c is used as a screening test:   >=6.5%   Diagnostic of Diabetes Mellitus           (if abnormal result  is confirmed)  5.7-6.4%   Increased risk of developing Diabetes Mellitus  References:Diagnosis and Classification of Diabetes Mellitus,Diabetes Care,2011,34(Suppl 1):S62-S69 and Standards of Medical Care in         Diabetes - 2011,Diabetes Care,2011,34  (Suppl 1):S11-S61.  05/11/2022: HbA1c 5.6% 09/13/2020: HbA1c 6.3%   Pt is on: - Jardiance 10 mg daily in am - Ozempic 0.25 >> 0.5 mg weekly We stopped metformin  05/2022.  He was taking the minimum dose.  Is checking sugars 1-2 times a day: - am: 90-100 >> 95-107 >> 74-89 >> 94-97 >> 81-94 - 2h after b'fast: n/c >> 117-198 >> n/c - before lunch: n/c >> 122 >> n/c - 2h after lunch: 120-141 >> 120-130 (off Ozempic) >> 97-121 - before dinner: n/c >> 99-128 >> n/c - 2h after dinner: 108-120s >> n/c  >> same as above >> 120s - bedtime: n/c >> 96-166 >> n/c - nighttime: n/c Lowest sugar was 74 >> 90 >> 74; it is unclear at which level he has hypoglycemia awareness Highest sugar was 198 >> ... 137 >> 141 >> 127  Glucometer: Contour Next  Pt's meals are: - Breakfast: tea + millet bread or oatmeal or cereal or eggs (rarely) - Lunch: salad or sandwich - Dinner: veggies + flat breads, chicken/fish/beef; occas rice - Snacks:  + diet soda; nuts; indian snack He drinks bitter mellon juice - 2 days a week.  He feels that this is helping with his blood sugars.  -No CKD, last BUN/creatinine:  03/31/2023:  13/1.12 05/11/2022: 13/1.24 09/13/2020: 48/1.05, GFR 73, glucose 120 Lab Results  Component Value Date   BUN 11 07/23/2019   BUN 10 06/30/2019   CREATININE 1.06 07/23/2019   CREATININE 1.13 06/30/2019  No results found for: "MICRALBCREAT" On lisinopril 20.  -+ HL; last set of lipids: 03/31/2023: 121/124/27.9/68 05/11/2022: 129/149/34.6/65 Lab Results  Component Value Date   CHOL 125 11/09/2021   HDL 32 (L) 11/09/2021   LDLCALC 72 11/09/2021   TRIG 111 11/09/2021   CHOLHDL 4.9 06/30/2019  09/13/2020: 120/199/29/68 On Crestor 5  +On ASA 81.  - last eye exam was 05/20/2023: No DR reportedly.   -He denies numbness and tingling in his feet.  Last foot exam was in 05/2022 here in clinic.  Pt has FH of DM in father.  Mother had PMR.  He also has a history of HTN. He has a history of transaminitis but his LFTs normalized 09/2020. He had GERD but this resolved after started C-PAP machine 09/2019 (moderate OSA).  He does 2-3 week detox treatments  in [place] 1-2x a year ([facility]). He loses weight and feels better after going to these retreats. During the coronavirus pandemic, he could not exercise, was less active, and ate more >> started to feel poorly.  At that time, he was started on Jardiance by Dr. Jacinto Halim and referred to endocrinology. In 2022, he had cardiac evaluation by Dr. Jacinto Halim - carotid U/S and 2D Echo: normal.    He does yoga for 25 to 30 minutes every morning.  ROS: + see HPI  I reviewed pt's medications, allergies, PMH, social hx, family hx, and changes were documented in the history of present illness. Otherwise, unchanged from my initial visit note.  Past Medical History:  Diagnosis Date   Hypertension    Past Surgical History:  Procedure Laterality Date   CARDIAC CATHETERIZATION     CARDIAC SURGERY     stent placement   KNEE ARTHROSCOPY Right 11/25/2022   Social History   Socioeconomic History   Marital status: Married    Spouse name: Not on file   Number of children: 2   Years of education: Not on file   Highest education level: Not on file  Occupational History   Not on file  Tobacco Use   Smoking status: Former    Current packs/day: 0.00    Average packs/day: 0.3 packs/day for 10.0 years (2.5 ttl pk-yrs)    Types: Cigarettes    Start date: 77    Quit date: 31    Years since quitting: 27.8   Smokeless tobacco: Never  Vaping Use   Vaping status: Never Used  Substance and Sexual Activity   Alcohol use: Not Currently    Comment: occ   Drug use: No   Sexual activity: Yes  Other Topics Concern   Not on file  Social History Narrative   Not on file   Social Determinants of Health   Financial Resource Strain: Not on file  Food Insecurity: Not on file  Transportation Needs: Not on file  Physical Activity: Not on file  Stress: Not on file  Social Connections: Not on file  Intimate Partner Violence: Not on file   Current Outpatient Medications on File Prior to Visit  Medication  Sig Dispense Refill   Cholecalciferol (VITAMIN D-3 PO) Take 1 capsule by mouth daily.     clopidogrel (PLAVIX) 75 MG tablet Take 1 tablet (75 mg total) by mouth daily. 90 tablet 3   Coenzyme Q10 (CO  Q-10 PO) Take 1 capsule by mouth daily.     empagliflozin (JARDIANCE) 10 MG TABS tablet Take 1 tablet (10 mg total) by mouth daily before breakfast. 90 tablet 3   glucose blood (CONTOUR NEXT TEST) test strip USE  STRIP TO CHECK GLUCOSE ONCE DAILY 100 each 0   lisinopril-hydrochlorothiazide (ZESTORETIC) 20-12.5 MG tablet Take 1 tablet by mouth every morning. 90 tablet 0   nitroGLYCERIN (NITROSTAT) 0.4 MG SL tablet DISSOLVE ONE TABLET UNDER THE TONGUE EVERY 5 MINUTES AS NEEDED FOR CHEST PAIN.  DO NOT EXCEED A TOTAL OF 3 DOSES IN 15 MINUTES 25 tablet 0   rosuvastatin (CRESTOR) 10 MG tablet Take 1 tablet by mouth once daily 90 tablet 3   Semaglutide,0.25 or 0.5MG /DOS, 2 MG/3ML SOPN Inject 0.5 mg into the skin once a week. 9 mL 3   No current facility-administered medications on file prior to visit.   No Known Allergies Family History  Problem Relation Age of Onset   Hypertension Mother    Stroke Mother    Heart attack Father    Diabetes Father    PE: There were no vitals taken for this visit. Wt Readings from Last 3 Encounters:  12/31/22 193 lb (87.5 kg)  12/05/22 194 lb (88 kg)  05/31/22 190 lb 3.2 oz (86.3 kg)   Constitutional: overweight, in NAD Eyes:  EOMI, no exophthalmos ENT: no neck masses, no cervical lymphadenopathy Cardiovascular: RRR, No MRG Respiratory: CTA B Musculoskeletal: no deformities Skin:no rashes Neurological: no tremor with outstretched hands  ASSESSMENT: 1. DM2, non-insulin-dependent, uncontrolled, with complications - CAD, s/p stent 2011 - Dr. Jacinto Halim  -He has no family history of medullary thyroid cancer or multiple endocrine neoplasia and no personal history of pancreatitis.  PLAN:  1. Patient with longstanding, previously uncontrolled type 2 diabetes, with  significant improvement in control after improving diet and quitting alcohol.  HbA1c at last visit was excellent, 5.6%, slightly higher than before, when he was 5.3%.  He continues on [place] and Ozempic.  Sugars were at goal at last visit despite having been off medications in preparation for knee surgery. -At today's visit, sugars are excellent, all at goal.  He continues with his low-carb diet.  He does not avoid carbs completely, though, but does not eat processed carbs and starches.  He continues to exercise consistently.  He is not drinking any alcohol.  I congratulated him for all this solitary changes.  For now, we can continue the current regimen but we discussed that we are using the medications mostly for cardiovascular and kidney protection, rather than his diabetes. - I suggested to:  Patient Instructions  Please continue: - Jardiance 10 mg daily before breakfast - Ozempic 0.5 mg weekly in a.m.   Please return in 6 mo with your sugar log.  - we checked his HbA1c: 5.1% (wonderful!) - advised to check sugars at different times of the day - 1x a day, rotating check times - advised for yearly eye exams >> he is UTD - return to clinic in 6 months  2. HL - Reviewed latest lipid panel from 03/2023: LDL above our target of less than 55 due to history of cardiovascular disease, HDL low, triglycerides at goal - Continues Crestor 5 mg once daily, and he came off fish oil  3.  Obesity class I -continue SGLT 2 inhibitor and GLP-1 receptor agonist which should also help with weight loss -In 2023, he lost approximately 25 pounds -Before last visit, he only gained 4 pounds,  despite having been traveling including a cruise and also being off his medications in preparation for his knee surgery. -Since last visit, he lost another 12 pounds  Carlus Pavlov, MD PhD [facility] Endocrinology

## 2023-06-02 NOTE — Patient Instructions (Addendum)
Please continue: - Jardiance 10 mg daily before breakfast - Ozempic 0.5 mg weekly in a.m.   Please stop at the lab.  Please return in 6 mo with your sugar log.

## 2023-06-03 LAB — MICROALBUMIN / CREATININE URINE RATIO
Creatinine,U: 48.4 mg/dL
Microalb Creat Ratio: 1.4 mg/g (ref 0.0–30.0)
Microalb, Ur: <0.7 mg/dL (ref 0.0–1.9)

## 2023-06-03 MED ORDER — CONTOUR NEXT TEST VI STRP: ORAL_STRIP | 5 refills | Status: DC

## 2023-06-04 ENCOUNTER — Other Ambulatory Visit: Payer: Self-pay | Admitting: Internal Medicine

## 2023-06-04 ENCOUNTER — Encounter: Payer: Self-pay | Admitting: Internal Medicine

## 2023-06-04 DIAGNOSIS — E1165 Type 2 diabetes mellitus with hyperglycemia: Secondary | ICD-10-CM

## 2023-06-04 NOTE — Telephone Encounter (Signed)
Jardiance refill request complete

## 2023-06-05 MED ORDER — EMPAGLIFLOZIN 10 MG PO TABS: 10.0000 mg | ORAL_TABLET | Freq: Every day | ORAL | 2 refills | Status: DC

## 2023-08-29 ENCOUNTER — Other Ambulatory Visit: Payer: Self-pay | Admitting: Cardiology

## 2023-09-08 ENCOUNTER — Telehealth: Payer: Self-pay | Admitting: Cardiology

## 2023-09-08 ENCOUNTER — Telehealth: Payer: Self-pay | Admitting: *Deleted

## 2023-09-08 DIAGNOSIS — Z01818 Encounter for other preprocedural examination: Secondary | ICD-10-CM | POA: Diagnosis not present

## 2023-09-08 NOTE — Telephone Encounter (Signed)
 Pt has been scheduled tele preop appt 09/29/23, per pt request. Med rec and consent are done.

## 2023-09-08 NOTE — Telephone Encounter (Signed)
 Pt has been scheduled tele preop appt 09/29/23, per pt request. Med rec and consent are done.      Patient Consent for Virtual Visit        Joshua Phillips has provided verbal consent on 09/08/2023 for a virtual visit (video or telephone).   CONSENT FOR VIRTUAL VISIT FOR:  Joshua Phillips  By participating in this virtual visit I agree to the following:  I hereby voluntarily request, consent and authorize [facility] and its employed or contracted physicians, physician assistants, nurse practitioners or other licensed health care professionals (the Practitioner), to provide me with telemedicine health care services (the "Services") as deemed necessary by the treating Practitioner. I acknowledge and consent to receive the Services by the Practitioner via telemedicine. I understand that the telemedicine visit will involve communicating with the Practitioner through live audiovisual communication technology and the disclosure of certain medical information by electronic transmission. I acknowledge that I have been given the opportunity to request an in-person assessment or other available alternative prior to the telemedicine visit and am voluntarily participating in the telemedicine visit.  I understand that I have the right to withhold or withdraw my consent to the use of telemedicine in the course of my care at any time, without affecting my right to future care or treatment, and that the Practitioner or I may terminate the telemedicine visit at any time. I understand that I have the right to inspect all information obtained and/or recorded in the course of the telemedicine visit and may receive copies of available information for a reasonable fee.  I understand that some of the potential risks of receiving the Services via telemedicine include:  Delay or interruption in medical evaluation due to technological equipment failure or disruption; Information transmitted may not be sufficient (e.g.  poor resolution of images) to allow for appropriate medical decision making by the Practitioner; and/or  In rare instances, security protocols could fail, causing a breach of personal health information.  Furthermore, I acknowledge that it is my responsibility to provide information about my medical history, conditions and care that is complete and accurate to the best of my ability. I acknowledge that Practitioner's advice, recommendations, and/or decision may be based on factors not within their control, such as incomplete or inaccurate data provided by me or distortions of diagnostic images or specimens that may result from electronic transmissions. I understand that the practice of medicine is not an exact science and that Practitioner makes no warranties or guarantees regarding treatment outcomes. I acknowledge that a copy of this consent can be made available to me via my patient portal [facility] MyChart), or I can request a printed copy by calling the office of [facility].    I understand that my insurance will be billed for this visit.   I have read or had this consent read to me. I understand the contents of this consent, which adequately explains the benefits and risks of the Services being provided via telemedicine.  I have been provided ample opportunity to ask questions regarding this consent and the Services and have had my questions answered to my satisfaction. I give my informed consent for the services to be provided through the use of telemedicine in my medical care

## 2023-09-08 NOTE — Telephone Encounter (Signed)
   Pre-operative Risk Assessment    Patient Name: Joshua Phillips  DOB: 1960-08-12 MRN: [id number]   Date of last office visit: 12/31/2022 Date of next office visit: none   Request for Surgical Clearance    Procedure:   colonoscopy  Date of Surgery:  Clearance 10/08/23                                Surgeon:  Dr. Kathi Der Surgeon's Group or Practice Name:  [facility]  Phone number:  [phone] Fax number:  [phone]   Type of Clearance Requested:   - Medical  - Pharmacy:  Hold Clopidogrel (Plavix)     Type of Anesthesia:   Propofol   Additional requests/questions:    Sharen Hones   09/08/2023, 11:41 AM

## 2023-09-08 NOTE — Telephone Encounter (Signed)
   Name: Joshua Phillips  DOB: 11/04/1960  MRN: [id number]  Primary Cardiologist: None   Preoperative team, please contact this patient and set up a phone call appointment for further preoperative risk assessment. Please obtain consent and complete medication review. Thank you for your help.  I confirm that guidance regarding antiplatelet and oral anticoagulation therapy has been completed and, if necessary, noted below.  Per protocol patient can hold Plavix 5 days prior to procedure. -Patient's previous stent length was 18 mm in the RCA.  I also confirmed the patient resides in the state of [place]. As per [facility] Medical Board telemedicine laws, the patient must reside in the state in which the provider is licensed.   Napoleon Form, Leodis Rains, NP 09/08/2023, 11:53 AM [facility]

## 2023-09-28 NOTE — Progress Notes (Unsigned)
 Virtual Visit via Telephone Note   Because of Joshua Phillips co-morbid illnesses, he is at least at moderate risk for complications without adequate follow up.  This format is felt to be most appropriate for this patient at this time.  Due to technical limitations with video connection (technology), today's appointment will be conducted as an audio only telehealth visit, and Joshua Phillips verbally agreed to proceed in this manner.   All issues noted in this document were discussed and addressed.  No physical exam could be performed with this format.  Evaluation Performed:  Preoperative cardiovascular risk assessment _____________   Date:  09/28/2023   Patient ID:  Joshua Phillips, DOB Feb 02, 1961, MRN [id number] Patient Location:  Home Provider location:   Office  Primary Care Provider:  Deatra James, MD Primary Cardiologist: Dr. Jacinto Halim  Chief Complaint / Patient Profile   63 y.o. y/o male with a h/o coronary artery disease, hyperlipidemia who is pending colonoscopy and presents today for telephonic preoperative cardiovascular risk assessment.  History of Present Illness    Joshua Phillips is a 63 y.o. male who presents via audio/video conferencing for a telehealth visit today.  Pt was last seen in cardiology clinic on 12/31/2022 by Dr. Jacinto Halim.  At that time Joshua Phillips was doing well .  The patient is now pending procedure as outlined above. Since his last visit, he remains stable from a cardiac standpoint.  Today he denies chest pain, shortness of breath, lower extremity edema, fatigue, palpitations, melena, hematuria, hemoptysis, diaphoresis, weakness, presyncope, syncope, orthopnea, and PND.   Past Medical History    Past Medical History:  Diagnosis Date   Hypertension    Past Surgical History:  Procedure Laterality Date   CARDIAC CATHETERIZATION     CARDIAC SURGERY     stent placement   KNEE ARTHROSCOPY Right 11/25/2022    Allergies  No Known Allergies  Home Medications     Prior to Admission medications   Medication Sig Start Date End Date Taking? Authorizing Provider  Cholecalciferol (VITAMIN D-3 PO) Take 1 capsule by mouth daily.    [provider]  clopidogrel (PLAVIX) 75 MG tablet Take 1 tablet (75 mg total) by mouth daily. 12/31/22   Yates Decamp, MD  Coenzyme Q10 (CO Q-10 PO) Take 1 capsule by mouth daily.    [provider]  empagliflozin (JARDIANCE) 10 MG TABS tablet Take 1 tablet (10 mg total) by mouth daily before breakfast. 06/05/23   Carlus Pavlov, MD  glucose blood (CONTOUR NEXT TEST) test strip USE  STRIP TO CHECK GLUCOSE ONCE DAILY 06/03/23   Carlus Pavlov, MD  lisinopril-hydrochlorothiazide (ZESTORETIC) 20-12.5 MG tablet TAKE 1 TABLET BY MOUTH ONCE DAILY IN THE MORNING 08/29/23   Yates Decamp, MD  nitroGLYCERIN (NITROSTAT) 0.4 MG SL tablet DISSOLVE ONE TABLET UNDER THE TONGUE EVERY 5 MINUTES AS NEEDED FOR CHEST PAIN.  DO NOT EXCEED A TOTAL OF 3 DOSES IN 15 MINUTES 02/04/23   Yates Decamp, MD  rosuvastatin (CRESTOR) 10 MG tablet Take 1 tablet by mouth once daily 11/22/22   Yates Decamp, MD  Semaglutide,0.25 or 0.5MG /DOS, 2 MG/3ML SOPN Inject 0.5 mg into the skin once a week. 12/23/22   Carlus Pavlov, MD    Physical Exam    Vital Signs:  Joshua Phillips does not have vital signs available for review today.  Given telephonic nature of communication, physical exam is limited. AAOx3. NAD. Normal affect.  Speech and respirations are unlabored.  Accessory Clinical Findings  None  Assessment & Plan    1.  Preoperative Cardiovascular Risk Assessment:Procedure:   colonoscopy   Date of Surgery:  Clearance 10/08/23                                  Surgeon:  Dr. Kathi Der Surgeon's Group or Practice Name:  [facility]       Phone number:  [phone] Fax number:  [phone]      Primary Cardiologist: None  Chart reviewed as part of pre-operative protocol coverage. Given past medical history and time  since last visit, based on ACC/AHA guidelines, Joshua Phillips would be at acceptable risk for the planned procedure without further cardiovascular testing.   Patient was advised that if he develops new symptoms prior to surgery to contact our office to arrange a follow-up appointment.  He verbalized understanding.  Per protocol patient can hold Plavix 5 days prior to procedure.   I will route this recommendation to the requesting party via Epic fax function and remove from pre-op pool.       Time:   Today, I have spent 5 minutes with the patient with telehealth technology discussing medical history, symptoms, and management plan.  I spent 10 minutes reviewing past medical history, cardiac medications, and cardiac tests   Ronney Asters, NP  09/28/2023, 7:48 PM

## 2023-09-29 ENCOUNTER — Ambulatory Visit: Payer: BC Managed Care – PPO | Attending: Cardiology

## 2023-09-29 DIAGNOSIS — Z01810 Encounter for preprocedural cardiovascular examination: Secondary | ICD-10-CM | POA: Diagnosis not present

## 2023-10-08 DIAGNOSIS — K635 Polyp of colon: Secondary | ICD-10-CM | POA: Diagnosis not present

## 2023-10-08 DIAGNOSIS — D125 Benign neoplasm of sigmoid colon: Secondary | ICD-10-CM | POA: Diagnosis not present

## 2023-10-08 DIAGNOSIS — Z1211 Encounter for screening for malignant neoplasm of colon: Secondary | ICD-10-CM | POA: Diagnosis not present

## 2023-10-08 DIAGNOSIS — K5730 Diverticulosis of large intestine without perforation or abscess without bleeding: Secondary | ICD-10-CM | POA: Diagnosis not present

## 2023-11-06 ENCOUNTER — Telehealth: Payer: Self-pay | Admitting: Pharmacy Technician

## 2023-11-06 ENCOUNTER — Other Ambulatory Visit (HOSPITAL_COMMUNITY): Payer: Self-pay

## 2023-11-06 NOTE — Telephone Encounter (Signed)
 Pharmacy Patient Advocate Encounter   Received notification from CoverMyMeds that prior authorization for Ozempic  is required/requested.   Insurance verification completed.   The patient is insured through [facility] .   Per test claim: The current 11/06/23 day co-pay is, $74.99- 3 months.  No PA needed at this time. This test claim was processed through [facility]- copay amounts may vary at other pharmacies due to pharmacy/plan contracts, or as the patient moves through the different stages of their insurance plan.

## 2023-11-27 ENCOUNTER — Other Ambulatory Visit: Payer: Self-pay | Admitting: Cardiology

## 2023-11-27 DIAGNOSIS — I25118 Atherosclerotic heart disease of native coronary artery with other forms of angina pectoris: Secondary | ICD-10-CM

## 2023-11-27 DIAGNOSIS — E785 Hyperlipidemia, unspecified: Secondary | ICD-10-CM

## 2023-12-30 ENCOUNTER — Encounter: Payer: Self-pay | Admitting: Cardiology

## 2023-12-30 ENCOUNTER — Ambulatory Visit: Payer: BC Managed Care – PPO | Admitting: Cardiology

## 2023-12-30 DIAGNOSIS — Z79899 Other long term (current) drug therapy: Secondary | ICD-10-CM

## 2023-12-30 DIAGNOSIS — E119 Type 2 diabetes mellitus without complications: Secondary | ICD-10-CM

## 2023-12-30 DIAGNOSIS — E785 Hyperlipidemia, unspecified: Secondary | ICD-10-CM

## 2023-12-30 DIAGNOSIS — I25118 Atherosclerotic heart disease of native coronary artery with other forms of angina pectoris: Secondary | ICD-10-CM

## 2023-12-30 NOTE — Telephone Encounter (Signed)
 CBC, CMP, A1c and fasting lipds. CAD, Hyperchol and hyperglycemia

## 2024-01-02 DIAGNOSIS — I25118 Atherosclerotic heart disease of native coronary artery with other forms of angina pectoris: Secondary | ICD-10-CM | POA: Diagnosis not present

## 2024-01-02 DIAGNOSIS — E119 Type 2 diabetes mellitus without complications: Secondary | ICD-10-CM | POA: Diagnosis not present

## 2024-01-02 DIAGNOSIS — Z79899 Other long term (current) drug therapy: Secondary | ICD-10-CM | POA: Diagnosis not present

## 2024-01-02 DIAGNOSIS — E785 Hyperlipidemia, unspecified: Secondary | ICD-10-CM | POA: Diagnosis not present

## 2024-01-02 LAB — COMPREHENSIVE METABOLIC PANEL WITH GFR
ALT: 20 IU/L (ref 0–44)
AST: 18 IU/L (ref 0–40)
Alkaline Phosphatase: 56 IU/L (ref 44–121)
Globulin, Total: 2.5 g/dL (ref 1.5–4.5)
Glucose: 82 mg/dL (ref 70–99)
Potassium: 4 mmol/L (ref 3.5–5.2)
Sodium: 141 mmol/L (ref 134–144)
eGFR: 68 mL/min/{1.73_m2} (ref >59)

## 2024-01-02 LAB — LIPID PANEL
Cholesterol, Total: 120 mg/dL (ref 100–199)
LDL Chol Calc (NIH): 66 mg/dL (ref 0–99)

## 2024-01-02 LAB — CBC

## 2024-01-03 LAB — COMPREHENSIVE METABOLIC PANEL WITH GFR
Albumin: 4.4 g/dL (ref 3.8–4.9)
BUN/Creatinine Ratio: 9 — ABNORMAL LOW (ref 10–24)
BUN: 11 mg/dL (ref 8–27)
Bilirubin Total: 0.6 mg/dL (ref 0.0–1.2)
CO2: 21 mmol/L (ref 20–29)
Calcium: 9.8 mg/dL (ref 8.6–10.2)
Chloride: 103 mmol/L (ref 96–106)
Creatinine, Ser: 1.22 mg/dL (ref 0.76–1.27)
Total Protein: 6.9 g/dL (ref 6.0–8.5)

## 2024-01-03 LAB — CBC
Hematocrit: 47.3 % (ref 37.5–51.0)
Hemoglobin: 15.8 g/dL (ref 13.0–17.7)
MCH: 29.9 pg (ref 26.6–33.0)
MCHC: 33.4 g/dL (ref 31.5–35.7)
MCV: 89 fL (ref 79–97)
Platelets: 211 10*3/uL (ref 150–450)
RBC: 5.29 x10E6/uL (ref 4.14–5.80)
RDW: 13 % (ref 11.6–15.4)
WBC: 4.6 10*3/uL (ref 3.4–10.8)

## 2024-01-03 LAB — HEMOGLOBIN A1C
Est. average glucose Bld gHb Est-mCnc: 105 mg/dL
Hgb A1c MFr Bld: 5.3 % (ref 4.8–5.6)

## 2024-01-03 LAB — LIPID PANEL
Chol/HDL Ratio: 3.4 ratio (ref 0.0–5.0)
HDL: 35 mg/dL — ABNORMAL LOW (ref >39)
Triglycerides: 103 mg/dL (ref 0–149)
VLDL Cholesterol Cal: 19 mg/dL (ref 5–40)

## 2024-01-07 ENCOUNTER — Encounter: Payer: Self-pay | Admitting: Cardiology

## 2024-01-07 ENCOUNTER — Ambulatory Visit: Attending: Cardiology | Admitting: Cardiology

## 2024-01-07 ENCOUNTER — Telehealth: Payer: Self-pay | Admitting: Radiology

## 2024-01-07 VITALS — BP 99/64 | HR 61 | Resp 16 | Ht 68.0 in | Wt 174.9 lb

## 2024-01-07 DIAGNOSIS — I25708 Atherosclerosis of coronary artery bypass graft(s), unspecified, with other forms of angina pectoris: Secondary | ICD-10-CM | POA: Diagnosis not present

## 2024-01-07 DIAGNOSIS — G4733 Obstructive sleep apnea (adult) (pediatric): Secondary | ICD-10-CM | POA: Diagnosis not present

## 2024-01-07 DIAGNOSIS — E785 Hyperlipidemia, unspecified: Secondary | ICD-10-CM

## 2024-01-07 DIAGNOSIS — I25118 Atherosclerotic heart disease of native coronary artery with other forms of angina pectoris: Secondary | ICD-10-CM

## 2024-01-07 DIAGNOSIS — I10 Essential (primary) hypertension: Secondary | ICD-10-CM

## 2024-01-07 MED ORDER — NITROGLYCERIN 0.4 MG SL SUBL: 0.4000 mg | SUBLINGUAL_TABLET | SUBLINGUAL | 1 refills | Status: DC | PRN

## 2024-01-07 MED ORDER — NITROGLYCERIN 0.4 MG SL SUBL: 0.4000 mg | SUBLINGUAL_TABLET | SUBLINGUAL | 1 refills | Status: AC | PRN

## 2024-01-07 MED ORDER — LISINOPRIL 5 MG PO TABS: 5.0000 mg | ORAL_TABLET | Freq: Every day | ORAL | 3 refills | Status: AC

## 2024-01-07 NOTE — Patient Instructions (Addendum)
 Medication Instructions:  Your physician has recommended you make the following change in your medication:  Stop lisinopril  hydrochlorothiazide  Start lisinopril  5 mg by mouth daily   *If you need a refill on your cardiac medications before your next appointment, please call your pharmacy*  Lab Work: Have lab work done today at the lab on the first floor  Lp(a) If you have labs (blood work) drawn today and your tests are completely normal, you will receive your results only by: MyChart Message (if you have MyChart) OR A paper copy in the mail If you have any lab test that is abnormal or we need to change your treatment, we will call you to review the results.  Testing/Procedures: Cardiac Stess PET  Follow-Up: At [facility], you and your health needs are our priority.  As part of our continuing mission to provide you with exceptional heart care, our providers are all part of one team.  This team includes your primary Cardiologist (physician) and Advanced Practice Providers or APPs (Physician Assistants and Nurse Practitioners) who all work together to provide you with the care you need, when you need it.  Your next appointment:   12 month(s)  Provider:   Gordy Bergamo, MD    We recommend signing up for the patient portal called MyChart.  Sign up information is provided on this After Visit Summary.  MyChart is used to connect with patients for Virtual Visits (Telemedicine).  Patients are able to view lab/test results, encounter notes, upcoming appointments, etc.  Non-urgent messages can be sent to your provider as well.   To learn more about what you can do with MyChart, go to [email or web address].   Other Instructions     Please report to Radiology at the [facility] Main Entrance 30 minutes early for your test.  [address] [place], [place] [address]                         OR   Please report to Radiology at [facility] Main  Entrance, medical mall, 30 mins prior to your test.  [address]  [place], [place]  How to Prepare for Your Cardiac PET/CT Stress Test:  Nothing to eat or drink, except water, 3 hours prior to arrival time.  NO caffeine/decaffeinated products, or chocolate 12 hours prior to arrival. (Please note decaffeinated beverages (teas/coffees) still contain caffeine).  If you have caffeine within 12 hours prior, the test will need to be rescheduled.  Medication instructions: Do not take erectile dysfunction medications for 72 hours prior to test (sildenafil, tadalafil) Do not take nitrates (isosorbide mononitrate, Ranexa) the day before or day of test Do not take tamsulosin the day before or morning of test Hold theophylline containing medications for 12 hours. Hold Dipyridamole 48 hours prior to the test.  Diabetic Preparation: If able to eat breakfast prior to 3 hour fasting, you may take all medications, including your insulin. Do not worry if you miss your breakfast dose of insulin - start at your next meal. If you do not eat prior to 3 hour fast-Hold all diabetes (oral and insulin) medications. Patients who wear a continuous glucose monitor MUST remove the device prior to scanning.  You may take your remaining medications with water.  NO perfume, cologne or lotion on chest or abdomen area.   Total time is 1 to 2 hours; you may want to bring reading material for the waiting time.  In preparation for your appointment, medication and supplies will be purchased.  Appointment availability is limited, so if you need to cancel or reschedule, please call the Radiology Department Scheduler at [phone] 24 hours in advance to avoid a cancellation fee of $100.00  What to Expect When you Arrive:  Once you arrive and check in for your appointment, you will be taken to a preparation room within the Radiology Department.  A technologist or Nurse will obtain your medical history, verify  that you are correctly prepped for the exam, and explain the procedure.  Afterwards, an IV will be started in your arm and electrodes will be placed on your skin for EKG monitoring during the stress portion of the exam. Then you will be escorted to the PET/CT scanner.  There, staff will get you positioned on the scanner and obtain a blood pressure and EKG.  During the exam, you will continue to be connected to the EKG and blood pressure machines.  A small, safe amount of a radioactive tracer will be injected in your IV to obtain a series of pictures of your heart along with an injection of a stress agent.    After your Exam:  It is recommended that you eat a meal and drink a caffeinated beverage to counter act any effects of the stress agent.  Drink plenty of fluids for the remainder of the day and urinate frequently for the first couple of hours after the exam.  Your doctor will inform you of your test results within 7-10 business days.  For more information and frequently asked questions, please visit our website: [email or web address]  For questions about your test or how to prepare for your test, please call: Cardiac Imaging Nurse Navigators Office: [phone]

## 2024-01-07 NOTE — Progress Notes (Addendum)
 Cardiology Office Note:  .   Date:  01/28/2024  ID:  Joshua Phillips, DOB 07/09/1961, MRN [id number] PCP: Sun, Vyvyan, MD  [facility] Providers Cardiologist:  Gordy Bergamo, MD   History of Present Illness: .   Joshua Phillips is a 63 y.o. Asian [place] male with CAD S/P stenting to his distal right coronary artery in July 2011 for exertional angina pectoris with resolution of symptoms, hypertension, hyperlipidemia, diabetes mellitus and moderate to severe obstructive sleep apnea diagnosed in February 2021, presents for 12 month follow-up visit.   Discussed the use of AI scribe software for clinical note transcription with the patient, who gave verbal consent to proceed.  History of Present Illness Joshua Phillips is a 63 year old male with hypertension and sleep apnea who presents with concerns about cardiac health following his brother's recent death from a massive heart attack. He experiences chest heaviness and episodes of dizziness and near-syncope, particularly concerning while driving. These symptoms began following his brother's sudden cardiac death two weeks ago, which he attributes to emotional stress.  He has hypertension managed with lisinopril  HCT. He experiences dizziness and lightheadedness, especially after taking his medication in the morning, possibly related to the diuretic component.  He was diagnosed with severe sleep apnea in 2021 and previously used a CPAP machine. After significant weight loss, he has not used the CPAP for about a year, noting improved energy levels and no longer snoring.  He is wondering whether he should have a repeat sleep study. His family history includes cardiac issues, notably his brother's recent death from a massive heart attack and a family history of hypertrophic obstructive cardiomyopathy. An echocardiogram showed no signs of hypertrophic cardiomyopathy.  He maintains a healthy lifestyle with regular exercise, yoga, and a controlled diet. He  intermittently uses Ozempic  for weight management and reports stable blood sugar levels. He also practices naturopathy, including periodic fruit diets and hot and cold therapy.  Labs   Lab Results  Component Value Date   CHOL 120 01/02/2024   HDL 35 (L) 01/02/2024   LDLCALC 66 01/02/2024   TRIG 103 01/02/2024   CHOLHDL 3.4 01/02/2024   Lab Results  Component Value Date   NA 141 01/02/2024   K 4.0 01/02/2024   CO2 21 01/02/2024   GLUCOSE 82 01/02/2024   BUN 11 01/02/2024   CREATININE 1.22 01/02/2024   CALCIUM  9.8 01/02/2024   EGFR 68 01/02/2024   GFRNONAA 78 07/23/2019      Latest Ref Rng & Units 01/02/2024    9:25 AM 07/23/2019   11:55 AM 06/30/2019   11:01 AM  BMP  Glucose 70 - 99 mg/dL 82  819  823   BUN 8 - 27 mg/dL 11  11  10    Creatinine 0.76 - 1.27 mg/dL 8.77  8.93  8.86   BUN/Creat Ratio 10 - 24 9  10  9    Sodium 134 - 144 mmol/L 141  142  139   Potassium 3.5 - 5.2 mmol/L 4.0  4.4  4.4   Chloride 96 - 106 mmol/L 103  103  103   CO2 20 - 29 mmol/L 21  22  22    Calcium  8.6 - 10.2 mg/dL 9.8  89.9  89.0       Latest Ref Rng & Units 01/02/2024    9:25 AM 06/30/2019   11:00 AM 09/29/2016    5:14 AM  CBC  WBC 3.4 - 10.8 x10E3/uL 4.6  7.2  5.8  Hemoglobin 13.0 - 17.7 g/dL 84.1  84.9  85.8   Hematocrit 37.5 - 51.0 % 47.3  43.6  40.6   Platelets 150 - 450 x10E3/uL 211  252  234    Lab Results  Component Value Date   HGBA1C 5.3 01/02/2024    Lab Results  Component Value Date   TSH 1.640 06/30/2019     ROS  Review of Systems  Cardiovascular:  Positive for chest pain. Negative for dyspnea on exertion and leg swelling.  Neurological:  Positive for dizziness.    Physical Exam:   VS:  BP 99/64 (BP Location: Left Arm, Patient Position: Sitting, Cuff Size: Normal)   Pulse 61   Resp 16   Ht 5' 8 (1.727 m)   Wt 174 lb 14.4 oz (79.3 kg)   SpO2 95%   BMI 26.59 kg/m    Wt Readings from Last 3 Encounters:  01/12/24 177 lb 9.6 oz (80.6 kg)  01/07/24 174 lb 14.4  oz (79.3 kg)  06/02/23 182 lb 12.8 oz (82.9 kg)    Physical Exam Neck:     Vascular: No carotid bruit or JVD.  Cardiovascular:     Rate and Rhythm: Normal rate and regular rhythm.     Pulses: Intact distal pulses.     Heart sounds: Normal heart sounds. No murmur heard.    No gallop.  Pulmonary:     Effort: Pulmonary effort is normal.     Breath sounds: Normal breath sounds.  Abdominal:     General: Bowel sounds are normal.     Palpations: Abdomen is soft.  Musculoskeletal:     Right lower leg: No edema.     Left lower leg: No edema.    Studies Reviewed: .     PTCA and stent to distal RCA 02/02/2010 2.5x18 mm Promus DES. Coronary angio 11/14/2010: Mild diffuse CAD of all three vessels. Stent patent. Ramus intermediate small with mid 90% stenosis.  Home Sleep Study 09/02/2019: Moderate -severe obstructive sleep apnea at AHI of 25.8/h with accentuation in REM sleep to 31.2/h. There is a trend to bradycardia noted, no hypoxemia events were correlated. CPAP recommended.   PCV MYOCARDIAL PERFUSION 11/26/2021 Normal ECG stress. Resting EKG demonstrated normal sinus rhythm. Peak EKG revealed < 1mm  non-specific ST depression, resolved immediately into recovery with no recurrence. The patient exercised for 8 minutes and 10 seconds of a Bruce protocol, achieving approximately 10.16 METs.  The blood pressure response was normal. Myocardial perfusion is abnormal. There is a very small moderate reversible defect in the apical region and a very small reversible mild defect in the septal region (LAD and RCA). Overall LV systolic function is normal without regional wall motion abnormalities. Stress LV EF: 64%. No previous exam available for comparison. Low risk.    PCV ECHOCARDIOGRAM COMPLETE 11/14/2021  Normal LV systolic function with visual EF 60-65%. Left ventricle cavity is normal in size. Normal left ventricular wall thickness. Normal global wall motion. Normal diastolic filling pattern, normal  LAP. Mild (Grade I) mitral regurgitation. Mild tricuspid regurgitation. No evidence of pulmonary hypertension. No prior study for comparison.  NM PET CT CARDIAC PERFUSION MULTI W/ABSOLUTE BLOODFLOW 01/28/2024    The study is normal. The study is low risk.   LV perfusion is normal.   Rest left ventricular function is normal. Rest EF: 55%. Stress left ventricular function is normal. Stress EF: 70%. End diastolic cavity size is normal. End systolic cavity size is normal.   Myocardial blood flow was  computed to be 0.60ml/g/min at rest and 1.39ml/g/min at stress. Global myocardial blood flow reserve was 3.00 and was normal.   Coronary calcium  assessment not performed due to prior revascularization. No significant extracardiac abnormality noted.   EKG:    EKG Interpretation Date/Time:  Wednesday January 07 2024 14:12:27 EDT Ventricular Rate:  63 PR Interval:  164 QRS Duration:  84 QT Interval:  392 QTC Calculation: 401 R Axis:   64  Text Interpretation: EKG 01/07/2024: Normal sinus rhythm with rate of 63 bpm.  Normal EKG. Confirmed by Angelette Ganus, Jagadeesh ([id number]) on 01/07/2024 2:29:07 PM    Medications ordered    Meds ordered this encounter  Medications   DISCONTD: nitroGLYCERIN  (NITROSTAT ) 0.4 MG SL tablet    Sig: Place 1 tablet (0.4 mg total) under the tongue every 5 (five) minutes as needed for chest pain.    Dispense:  25 tablet    Refill:  1   lisinopril  (ZESTRIL ) 5 MG tablet    Sig: Take 1 tablet (5 mg total) by mouth daily.    Dispense:  90 tablet    Refill:  3   nitroGLYCERIN  (NITROSTAT ) 0.4 MG SL tablet    Sig: Place 1 tablet (0.4 mg total) under the tongue every 5 (five) minutes as needed for chest pain.    Dispense:  25 tablet    Refill:  1     ASSESSMENT AND PLAN: .      ICD-10-CM   1. Coronary artery disease of native artery of native heart with stable angina pectoris (HCC)  I25.118 EKG 12-Lead    lisinopril  (ZESTRIL ) 5 MG tablet    NM PET CT CARDIAC PERFUSION MULTI  W/ABSOLUTE BLOODFLOW    nitroGLYCERIN  (NITROSTAT ) 0.4 MG SL tablet    Cardiac Stress Test: Informed Consent Details: Physician/Practitioner Attestation; Transcribe to consent form and obtain patient signature    DISCONTINUED: nitroGLYCERIN  (NITROSTAT ) 0.4 MG SL tablet    2. Hyperlipidemia LDL goal <70  E78.5 Lipoprotein A (LPA)    3. Primary hypertension  I10 lisinopril  (ZESTRIL ) 5 MG tablet    NM PET CT CARDIAC PERFUSION MULTI W/ABSOLUTE BLOODFLOW    4. OSA (obstructive sleep apnea)  G47.33 [place] Sleep Study     Assessment & Plan Chest Pain Intermittent chest pain and tightness possibly related to stress after brother's death. No prior cardiac symptoms. Normal EKG and previous nuclear stress test.  With episodes of chest pain with exertion, also with stress, progression of coronary disease cannot be excluded especially in the diabetic.  Plan to rule out cardiac causes with further testing. Patients on appropriate medications rarely present with massive heart attacks, and if a cardiac event occurs, it is likely to have warning signs and not result in cardiogenic shock.  I reassured the patient with these statements. - Order cardiac PET stress test - Order LPA blood test to assess lipoprotein A levels - NTG refilled.  Hypertension Hypertension previously managed with lisinopril  HCT. Recent weight loss and lifestyle changes may have resolved hypertension, as current blood pressure is 99/64 mmHg. Symptoms of dizziness and lightheadedness may be related to overmedication. - Discontinue lisinopril  HCT - Prescribe lisinopril  5 mg daily for cardiovascular protection - Suspect resolution of hypertension with weight loss  Dizziness and Lightheadedness Episodes of dizziness and lightheadedness possibly related to overmedication with lisinopril  HCT, especially after weight loss. Symptoms occur after taking medication in the morning. - Monitor symptoms and report if he persists  Severe Sleep  Apnea Severe sleep apnea diagnosed in  2021. Reports not using CPAP for about a year due to weight loss and improved symptoms. Importance of a sleep study to reassess the need for CPAP, as untreated sleep apnea can affect quality of life and cardiovascular health. - Order sleep study to reassess sleep apnea status  Diabetes mellitus type 2 controlled Since patient is on Ozempic , significant weight loss, lifestyle modification and lifestyle changes, his A1c is now in the prediabetic range.  I have congratulated him.  Hypercholesterolemia Check Lp(a).  His LDL is still >55.  Hence I will consider changing his medications once I have Lp(a) available to me for further management.  Presently doing well on Crestor  10 mg daily.  Follow-up Follow-up plans discussed to ensure comprehensive evaluation and management of symptoms and risk factors. - Perform LPA blood test today - Schedule cardiac PET stress test - Follow up in 1 year or sooner if stress test is abnormal.  Signed,  Gordy Bergamo, MD, [facility] 01/28/2024, 9:39 PM [facility] [address] [place], [place] [address] Phone: [phone]. Fax:  [phone]

## 2024-01-07 NOTE — Telephone Encounter (Signed)
 Patient agreement reviewed and signed on 01/07/2024.  WatchPAT issued to patient on 01/07/2024 by Woodie LOISE Prudent. Patient aware to not open the WatchPAT box until contacted with the activation PIN. Patient profile initialized in CloudPAT on 01/07/2024 by Rockie RAMAN. Device serial number: [id number]  Please list Reason for Call as Advice Only and type WatchPAT issued to patient in the comment box.

## 2024-01-09 ENCOUNTER — Ambulatory Visit: Payer: Self-pay | Admitting: Cardiology

## 2024-01-09 LAB — LIPOPROTEIN A (LPA): Lipoprotein (a): 10.3 nmol/L (ref <75.0)

## 2024-01-09 NOTE — Progress Notes (Signed)
 Normal Lp(a) at 10.3 on 01/07/2024

## 2024-01-12 ENCOUNTER — Encounter: Payer: Self-pay | Admitting: Internal Medicine

## 2024-01-12 ENCOUNTER — Ambulatory Visit: Payer: BC Managed Care – PPO | Admitting: Internal Medicine

## 2024-01-12 VITALS — BP 118/70 | HR 67 | Ht 68.0 in | Wt 177.6 lb

## 2024-01-12 DIAGNOSIS — Z7984 Long term (current) use of oral hypoglycemic drugs: Secondary | ICD-10-CM

## 2024-01-12 DIAGNOSIS — E663 Overweight: Secondary | ICD-10-CM

## 2024-01-12 DIAGNOSIS — E1165 Type 2 diabetes mellitus with hyperglycemia: Secondary | ICD-10-CM

## 2024-01-12 DIAGNOSIS — Z7985 Long-term (current) use of injectable non-insulin antidiabetic drugs: Secondary | ICD-10-CM

## 2024-01-12 DIAGNOSIS — E785 Hyperlipidemia, unspecified: Secondary | ICD-10-CM | POA: Diagnosis not present

## 2024-01-12 NOTE — Progress Notes (Signed)
 Patient ID: Joshua Phillips, male   DOB: [birth year], 63 y.o.   MRN: [id number]   HPI: Joshua Phillips is a 63 y.o.-year-old male, initially referred by his cardiologist, Dr. Ladona, returning for follow-up for DM2, dx in 06/2019, non-insulin-dependent, uncontrolled, with complications (CAD s/p stent 01/2010).  Last visit 6 months ago.  Interim history: No increased urination, blurry vision, nausea, chest pain.  He has constipation  - on Metamucil. In 2023, he started to adjust his diet: He eats a lighter dinner.Joshua Phillips Avoids sugar (except for fruit), dairy, oil. No alcohol in ~4 years.  He usually goes to a detox program in [place] twice a year - loses 14-15 lbs there.  He is off diabetic medicines while there. He will go again in 02/2024. He came off Toprol , Norvasc, Brilinta  before last OV. More recently, he had some chest pain with dizziness, attributed to stress due to his brother's sudden death from a massive MI at the beginning of this month.  However, his lisinopril  dose was decreased and he was taken off diuretic then.  Reviewed HbA1c levels: Lab Results  Component Value Date   HGBA1C 5.3 01/02/2024   HGBA1C 5.1 06/02/2023   HGBA1C 5.6 12/05/2022   HGBA1C 5.3 05/31/2022   HGBA1C 6.1 (H) 11/09/2021   HGBA1C 6.0 (A) 06/25/2021   HGBA1C 6.1 (A) 11/20/2020   HGBA1C 6.6 (A) 10/18/2019   HGBA1C 8.3 (H) 06/30/2019   HGBA1C (H) 11/03/2010    5.7 (NOTE)                                                                       According to the ADA Clinical Practice Recommendations for 2011, when HbA1c is used as a screening test:   >=6.5%   Diagnostic of Diabetes Mellitus           (if abnormal result  is confirmed)  5.7-6.4%   Increased risk of developing Diabetes Mellitus  References:Diagnosis and Classification of Diabetes Mellitus,Diabetes Care,2011,34(Suppl 1):S62-S69 and Standards of Medical Care in         Diabetes - 2011,Diabetes Care,2011,34  (Suppl 1):S11-S61.  05/11/2022: HbA1c 5.6% 09/13/2020:  HbA1c 6.3%   Pt is on: - Jardiance  10 mg daily in am - Ozempic  0.25 >> 0.5 mg weekly We stopped metformin  05/2022.  He was taking the minimum dose.  Is checking sugars 1-2 times a day: - am: 90-100 >> 95-107 >> 74-89 >> 94-97 >> 81-94 >> 80s-90s - 2h after b'fast: n/c >> 117-198 >> n/c - before lunch: n/c >> 122 >> n/c - 2h after lunch: 120-141 >> 120-130 (off Ozempic ) >> 97-121 >> n/c - before dinner: n/c >> 99-128 >> n/c - 2h after dinner: 108-120s >> n/c  >> same as above >> 120s >>  90s-114 - bedtime: n/c >> 96-166 >> n/c - nighttime: n/c Lowest sugar was 74 >> 90 >> 74 >> 80s; it is unclear at which level he has hypoglycemia awareness Highest sugar was 198 >> ... 137 >> 141 >> 127 >> 114.  Glucometer: Contour Next  Pt's meals are now occasional fruit days: - Breakfast: tea + millet bread or oatmeal or cereal or eggs (rarely) >> tea + almond milk, [place] potato chips - Lunch: salad or sandwich >>  berries, banana, papaya, watermelon - Dinner: veggies + flat breads, chicken/fish/beef; occas rice >> tea and fruit again - Snacks:  + diet soda; nuts; indian snack He drinks bitter mellon juice - 2 days a week.  He feels that this is helping with his blood sugars.  -No CKD, last BUN/creatinine:  Lab Results  Component Value Date   BUN 11 01/02/2024   BUN 11 07/23/2019   CREATININE 1.22 01/02/2024   CREATININE 1.06 07/23/2019  No results found for: MICRALBCREAT On lisinopril  20 >> 5.  -+ HL; last set of lipids: Lab Results  Component Value Date   CHOL 120 01/02/2024   HDL 35 (L) 01/02/2024   LDLCALC 66 01/02/2024   TRIG 103 01/02/2024   CHOLHDL 3.4 01/02/2024  On Crestor  5  + - last eye exam was 05/20/2023: No DR reportedly.   -He denies numbness and tingling in his feet.  Last foot exam was in 05/2023 here in clinic.  Pt has FH of DM in father.  Mother had PMR.  He also has a history of HTN. He has a history of transaminitis but his LFTs normalized 09/2020. He  had GERD but this resolved after started C-PAP machine 09/2019 (moderate OSA).  He does 2-3 week detox treatments in [place] 1-2x a year ([facility]). He loses weight and feels better after going to these retreats. During the coronavirus pandemic, he could not exercise, was less active, and ate more >> started to feel poorly.  At that time, he was started on Jardiance  by Dr. Ladona and referred to endocrinology. In 2022, he had cardiac evaluation by Dr. Ganji - carotid U/S and 2D Echo: normal.  He has a family history of hokum.   He does yoga for 25 to 30 minutes every morning.  ROS: + see HPI  I reviewed pt's medications, allergies, PMH, social hx, family hx, and changes were documented in the history of present illness. Otherwise, unchanged from my initial visit note.  Past Medical History:  Diagnosis Date   Hypertension    Past Surgical History:  Procedure Laterality Date   CARDIAC CATHETERIZATION     CARDIAC SURGERY     stent placement   KNEE ARTHROSCOPY Right 11/25/2022   Social History   Socioeconomic History   Marital status: Married    Spouse name: Not on file   Number of children: 2   Years of education: Not on file   Highest education level: Not on file  Occupational History   Not on file  Tobacco Use   Smoking status: Former    Current packs/day: 0.00    Average packs/day: 0.3 packs/day for 10.0 years (2.5 ttl pk-yrs)    Types: Cigarettes    Start date: 84    Quit date: 73    Years since quitting: 28.5   Smokeless tobacco: Never  Vaping Use   Vaping status: Never Used  Substance and Sexual Activity   Alcohol use: Not Currently    Comment: occ   Drug use: No   Sexual activity: Yes  Other Topics Concern   Not on file  Social History Narrative   Not on file   Social Drivers of Health   Financial Resource Strain: Not on file  Food Insecurity: Not on file  Transportation Needs: Not on file  Physical Activity: Not on file  Stress: Not on file   Social Connections: Not on file  Intimate Partner Violence: Not on file   Current Outpatient Medications on File Prior  to Visit  Medication Sig Dispense Refill   Cholecalciferol (VITAMIN D -3 PO) Take 1 capsule by mouth daily.     clopidogrel  (PLAVIX ) 75 MG tablet Take 1 tablet by mouth once daily 90 tablet 0   Coenzyme Q10 (CO Q-10 PO) Take 1 capsule by mouth daily.     empagliflozin  (JARDIANCE ) 10 MG TABS tablet Take 1 tablet (10 mg total) by mouth daily before breakfast. 90 tablet 2   glucose blood (CONTOUR NEXT TEST) test strip USE  STRIP TO CHECK GLUCOSE ONCE DAILY 100 each 5   lisinopril  (ZESTRIL ) 5 MG tablet Take 1 tablet (5 mg total) by mouth daily. 90 tablet 3   nitroGLYCERIN  (NITROSTAT ) 0.4 MG SL tablet Place 1 tablet (0.4 mg total) under the tongue every 5 (five) minutes as needed for chest pain. 25 tablet 1   rosuvastatin  (CRESTOR ) 10 MG tablet Take 1 tablet by mouth once daily 90 tablet 0   Semaglutide ,0.25 or 0.5MG /DOS, 2 MG/3ML SOPN Inject 0.5 mg into the skin once a week. 9 mL 3   No current facility-administered medications on file prior to visit.   No Known Allergies Family History  Problem Relation Age of Onset   Hypertension Mother    Stroke Mother    Heart attack Father    Diabetes Father    PE: BP 118/70   Pulse 67   Ht 5' 8 (1.727 m)   Wt 177 lb 9.6 oz (80.6 kg)   SpO2 96%   BMI 27.00 kg/m  Wt Readings from Last 3 Encounters:  01/12/24 177 lb 9.6 oz (80.6 kg)  01/07/24 174 lb 14.4 oz (79.3 kg)  06/02/23 182 lb 12.8 oz (82.9 kg)   Constitutional: overweight, in NAD Eyes:  EOMI, no exophthalmos ENT: no neck masses, no cervical lymphadenopathy Cardiovascular: RRR, No MRG Respiratory: CTA B Musculoskeletal: no deformities Skin:no rashes Neurological: no tremor with outstretched hands  ASSESSMENT: 1. DM2, non-insulin-dependent, uncontrolled, with complications - CAD, s/p stent 2011 - Dr. Ladona  -He has no family history of medullary thyroid   cancer or multiple endocrine neoplasia and no personal history of pancreatitis.  2. HL  3.  Overweight  PLAN:  1. Patient with longstanding, previously uncontrolled type 2 diabetes, but with significant improvement in control after improving diet and stopping alcohol.  His latest HbA1c was excellent, at 5.3% 10 days ago.  This was actually an increased from the HbA1c of 5.1% obtained at last visit, we did not change his regimen at that time.  He continues on SGLT2 inhibitor and GLP-1 receptor agonist.  We discussed that we are using these medicines mostly for cardiovascular and kidney protection, rather than diabetes. - Since last visit, at the beginning of this month, he had major stress with his brother having a massive MI and dying suddenly.  He was traveling and eating out and he felt he gained weight recently.  He is preparing to go to [place] again to the health resort in 02/2024. -At today's visit, blood sugars remain well-controlled.  I did not suggest a change in regimen.  He would be interested in reducing the Ozempic  dose, but for now we decided to continue the same dose.  I did explain that he will have plateaued in his weight loss, especially with the stress that he recently went through.  Regarding Jardiance , I do not feel we absolutely need to the dose for now.  Study showed that even 10 mg of Jardiance  would be enough to give cardiovascular and renal  benefits. - I suggested to:  Patient Instructions  Please continue: - Jardiance  10 mg daily before breakfast - Ozempic  0.5 mg weekly in a.m.   Please return in 6 mo with your sugar log.  - advised to check sugars at different times of the day - 1x a day, rotating check times - advised for yearly eye exams >> he is UTD - will check an ACR today - return to clinic in 6 months  2. HL -Reviewed latest lipid panel from 01/02/2024: Fractions at goal with the exception of an LDL of 66, slightly above our target of less than 55 due to  history of cardiovascular disease. - He continues on Crestor  5 mg daily  3.  Overweight -continue SGLT 2 inhibitor and GLP-1 receptor agonist which should also help with weight loss -In 2023, he lost approximately 25 pounds -Afterwards, he lost weight again, approximately 20 pounds in the last year  Lela Fendt, MD PhD [facility]

## 2024-01-12 NOTE — Patient Instructions (Addendum)
Please continue: - Jardiance 10 mg daily before breakfast - Ozempic 0.5 mg weekly in a.m.   Please return in 6 months.

## 2024-01-13 ENCOUNTER — Ambulatory Visit: Payer: Self-pay | Admitting: Internal Medicine

## 2024-01-13 LAB — MICROALBUMIN / CREATININE URINE RATIO
Creatinine, Urine: 100 mg/dL (ref 20–320)
Microalb, Ur: <0.2 mg/dL

## 2024-01-19 ENCOUNTER — Telehealth: Payer: Self-pay | Admitting: Cardiology

## 2024-01-19 NOTE — Telephone Encounter (Signed)
 Patient calling to follow up on sleep study, he would like to go ahead and start it.

## 2024-01-20 NOTE — Telephone Encounter (Signed)
**Note De-Identified Joshua Phillips Obfuscation** Ordering provider: Dr Ladona Associated diagnoses: OSA-G47.33 and CAD-I25.10  WatchPAT PA obtained on 01/20/2024 by Joshua Phillips, Joshua HERO, LPN. Authorization: Per the [employer] Provider Portal this [place] PA as follows: Order ID: [id number] Authorized Approval Valid dates: 01/20/2024 - 03/19/2024  Patient notified of PIN ([id number]) on 01/20/2024 Joshua Phillips Notification Method: phone.  Phone note routed to covering staff for follow-up.

## 2024-01-21 ENCOUNTER — Encounter (INDEPENDENT_AMBULATORY_CARE_PROVIDER_SITE_OTHER): Payer: Self-pay | Admitting: Cardiology

## 2024-01-21 DIAGNOSIS — G4733 Obstructive sleep apnea (adult) (pediatric): Secondary | ICD-10-CM

## 2024-01-21 DIAGNOSIS — R0683 Snoring: Secondary | ICD-10-CM

## 2024-01-23 NOTE — Telephone Encounter (Signed)
 Joshua Phillips, can you please follow up on this

## 2024-01-25 NOTE — Procedures (Signed)
   SLEEP STUDY REPORT Patient Information Study Date: 01/21/2024 Patient Name: Joshua Phillips Patient ID: [id number] Birth Date: 05-23-1961 Age: 63 Gender: Male BMI: 26.4 (W=174 lb, H=5' 8'') Stopbang: 4 Referring Physician: Gordy Bergamo, MD  TEST DESCRIPTION: Home sleep apnea testing was completed using the WatchPat, a Type 1 device, utilizing peripheral arterial tonometry (PAT), chest movement, actigraphy, pulse oximetry, pulse rate, body position and snore. AHI was calculated with apnea and hypopnea using valid sleep time as the denominator. RDI includes apneas, hypopneas, and RERAs. The data acquired and the scoring of sleep and all associated events were performed in accordance with the recommended standards and specifications as outlined in the AASM Manual for the Scoring of Sleep and Associated Events 2.2.0 (2015).  FINDINGS: 1. No evidence of Obstructive Sleep Apnea with AHI 1.3/hr. 2. No Central Sleep Apnea. 3. Oxygen desaturations as low as 87%. 4. Mild to moderate snoring was present. O2 sats were < 88% for 0 minutes. 5. Total sleep time was 6 hrs and 43 min. 6. 17.4% of total sleep time was spent in REM sleep. 7. Normal sleep onset latency at 19 min. 8. Shortened REM sleep onset latency at 70 min. 9. Total awakenings were 10.  DIAGNOSIS: Normal study with no significant sleep disordered breathing.  RECOMMENDATIONS: 1. Normal study with no significant sleep disordered breathing. 2. Healthy sleep recommendations include: adequate nightly sleep (normal 7-9 hrs/night), avoidance of caffeine after noon and alcohol near bedtime, and maintaining a sleep environment that is cool, dark and quiet. 3. Weight loss for overweight patients is recommended. 4. Snoring recommendations include: weight loss where appropriate, side sleeping, and avoidance of alcohol before bed. 5. Operation of motor vehicle or dangerous equipment must be avoided when feeling drowsy, excessively sleepy,  or mentally fatigued. 6. An ENT consultation which may be useful for specific causes of and possible treatment of bothersome snoring . 7. Weight loss may be of benefit in reducing the severity of snoring.   Signature: Wilbert Bihari, MD; [facility]; Diplomat, American Board of Sleep Medicine Electronically Signed: 01/25/2024 6:52:22 PM

## 2024-01-26 ENCOUNTER — Telehealth: Payer: Self-pay

## 2024-01-26 ENCOUNTER — Encounter (HOSPITAL_COMMUNITY): Payer: Self-pay

## 2024-01-26 ENCOUNTER — Ambulatory Visit: Attending: Cardiology

## 2024-01-26 DIAGNOSIS — G4733 Obstructive sleep apnea (adult) (pediatric): Secondary | ICD-10-CM

## 2024-01-26 NOTE — Telephone Encounter (Signed)
-----   Message from Joshua Phillips sent at 01/25/2024  6:53 PM EDT ----- Please let patient know that sleep study showed no significant sleep apnea.

## 2024-01-26 NOTE — Telephone Encounter (Signed)
 Notified patient of sleep study results and recommendations. All questions were answered and patient verbalized understanding.

## 2024-01-26 NOTE — Telephone Encounter (Signed)
Sleep study results in chart 

## 2024-01-28 ENCOUNTER — Encounter (HOSPITAL_COMMUNITY)
Admission: RE | Admit: 2024-01-28 | Discharge: 2024-01-28 | Disposition: A | Discharge status: Home / Self Care | Source: Ambulatory Visit | Attending: Cardiology | Admitting: Cardiology

## 2024-01-28 DIAGNOSIS — I25708 Atherosclerosis of coronary artery bypass graft(s), unspecified, with other forms of angina pectoris: Secondary | ICD-10-CM | POA: Diagnosis not present

## 2024-01-28 DIAGNOSIS — I10 Essential (primary) hypertension: Secondary | ICD-10-CM | POA: Insufficient documentation

## 2024-01-28 LAB — NM PET CT CARDIAC PERFUSION MULTI W/ABSOLUTE BLOODFLOW
LV dias vol: 64 mL (ref 62–150)
LV sys vol: 29 mL (ref 4.2–5.8)
MBFR: 3
Nuc Rest EF: 55 %
Nuc Stress EF: 70 %
Peak HR: 88 {beats}/min
Rest HR: 58 {beats}/min
Rest MBF: 0.6 ml/g/min
Rest Nuclear Isotope Dose: 21.1 mCi
ST Depression (mm): 0 mm
Stress MBF: 1.8 ml/g/min
Stress Nuclear Isotope Dose: 20.7 mCi

## 2024-01-28 MED ORDER — RUBIDIUM RB82 GENERATOR (RUBYFILL)
21.1100 | PACK | Freq: Once | INTRAVENOUS | Status: AC
  Administered 2024-01-28: 21.11 via INTRAVENOUS

## 2024-01-28 MED ORDER — REGADENOSON 0.4 MG/5ML IV SOLN
0.4000 mg | Freq: Once | INTRAVENOUS | Status: AC
  Administered 2024-01-28: 0.4 mg via INTRAVENOUS

## 2024-01-28 MED ORDER — REGADENOSON 0.4 MG/5ML IV SOLN
INTRAVENOUS | Status: AC
  Filled 2024-01-28: qty 5

## 2024-01-28 MED ORDER — RUBIDIUM RB82 GENERATOR (RUBYFILL)
20.7100 | PACK | Freq: Once | INTRAVENOUS | Status: AC
  Administered 2024-01-28: 20.71 via INTRAVENOUS

## 2024-01-28 NOTE — Progress Notes (Signed)
 Your PET nuclear perfusion scan reveals excellent myocardial blood flow reserve, from rest to stress increased your myocardial blood flow by 3 times, anything greater than 1.5-2 is excellent. No evidence of any perfusion abnormalities suggesting no significant blockages/coronary artery disease. Normal LV systolic function, ejection fraction 55%.  Very low risk study, continue present medical management.

## 2024-02-10 DIAGNOSIS — Z125 Encounter for screening for malignant neoplasm of prostate: Secondary | ICD-10-CM | POA: Diagnosis not present

## 2024-02-10 DIAGNOSIS — Z0000 Encounter for general adult medical examination without abnormal findings: Secondary | ICD-10-CM | POA: Diagnosis not present

## 2024-02-16 ENCOUNTER — Other Ambulatory Visit: Payer: Self-pay | Admitting: Internal Medicine

## 2024-02-18 ENCOUNTER — Other Ambulatory Visit: Payer: Self-pay | Admitting: Cardiology

## 2024-02-18 DIAGNOSIS — I25118 Atherosclerotic heart disease of native coronary artery with other forms of angina pectoris: Secondary | ICD-10-CM

## 2024-02-18 DIAGNOSIS — E785 Hyperlipidemia, unspecified: Secondary | ICD-10-CM

## 2024-05-20 ENCOUNTER — Other Ambulatory Visit: Payer: Self-pay | Admitting: Cardiology

## 2024-05-20 DIAGNOSIS — E785 Hyperlipidemia, unspecified: Secondary | ICD-10-CM

## 2024-05-20 DIAGNOSIS — I25118 Atherosclerotic heart disease of native coronary artery with other forms of angina pectoris: Secondary | ICD-10-CM

## 2024-06-14 DIAGNOSIS — J069 Acute upper respiratory infection, unspecified: Secondary | ICD-10-CM | POA: Diagnosis not present

## 2024-07-08 ENCOUNTER — Other Ambulatory Visit: Payer: Self-pay | Admitting: Internal Medicine

## 2024-07-08 DIAGNOSIS — E1165 Type 2 diabetes mellitus with hyperglycemia: Secondary | ICD-10-CM

## 2024-07-19 ENCOUNTER — Encounter: Payer: Self-pay | Admitting: Internal Medicine

## 2024-07-19 ENCOUNTER — Ambulatory Visit: Admitting: Internal Medicine

## 2024-07-19 ENCOUNTER — Other Ambulatory Visit

## 2024-07-19 VITALS — BP 120/70 | HR 66 | Ht 68.0 in | Wt 173.4 lb

## 2024-07-19 DIAGNOSIS — Z7985 Long-term (current) use of injectable non-insulin antidiabetic drugs: Secondary | ICD-10-CM | POA: Diagnosis not present

## 2024-07-19 DIAGNOSIS — E663 Overweight: Secondary | ICD-10-CM

## 2024-07-19 DIAGNOSIS — E785 Hyperlipidemia, unspecified: Secondary | ICD-10-CM

## 2024-07-19 DIAGNOSIS — E1165 Type 2 diabetes mellitus with hyperglycemia: Secondary | ICD-10-CM | POA: Diagnosis not present

## 2024-07-19 DIAGNOSIS — G4710 Hypersomnia, unspecified: Secondary | ICD-10-CM | POA: Diagnosis not present

## 2024-07-19 DIAGNOSIS — R5383 Other fatigue: Secondary | ICD-10-CM

## 2024-07-19 DIAGNOSIS — Z7984 Long term (current) use of oral hypoglycemic drugs: Secondary | ICD-10-CM | POA: Diagnosis not present

## 2024-07-19 LAB — POCT GLYCOSYLATED HEMOGLOBIN (HGB A1C): Hemoglobin A1C: 5 % (ref 4.0–5.6)

## 2024-07-19 MED ORDER — ROSUVASTATIN CALCIUM 5 MG PO TABS: 5.0000 mg | ORAL_TABLET | Freq: Every day | ORAL | 3 refills | Status: AC

## 2024-07-19 MED ORDER — GLUCOSE BLOOD VI STRP: ORAL_STRIP | 3 refills | Status: AC

## 2024-07-19 MED ORDER — ACCU-CHEK SOFTCLIX LANCETS MISC: 3 refills | Status: AC

## 2024-07-19 MED ORDER — OZEMPIC (0.25 OR 0.5 MG/DOSE) 2 MG/3ML ~~LOC~~ SOPN: PEN_INJECTOR | SUBCUTANEOUS | 3 refills | Status: AC

## 2024-07-19 MED ORDER — ACCU-CHEK GUIDE W/DEVICE KIT: PACK | 0 refills | Status: AC

## 2024-07-19 NOTE — Patient Instructions (Addendum)
 Please continue: - Jardiance  10 mg daily before breakfast  You can decrease: - Ozempic  0.25-0.5 mg weekly in a.m.   Please return in 6 months.

## 2024-07-19 NOTE — Progress Notes (Addendum)
 Patient ID: Joshua Phillips, male   DOB: 11/09/1960, 64 y.o.   MRN: [id number]   HPI: Joshua Phillips is a 64 y.o.-year-old male, initially referred by his cardiologist, Dr. Ladona, returning for follow-up for DM2, dx in 06/2019, non-insulin-dependent, uncontrolled, with complications (CAD s/p stent 01/2010).  Last visit 6 months ago.  Interim history: No increased urination, blurry vision, nausea, chest pain.  In 2023, he started to adjust his diet: lighter dinner initially avoiding sugars (except for fruit), dairy, oil. No alcohol in >4 years.  He usually goes to a detox program in [place] twice a year - loses 14-15 lbs there.  He is off diabetic medicines while there.  He had a severe case of flu 1.5 mo ago >> still dry coughing. He was not able to exercise, diet, but was still doing yoga. Now restarting exercise and diet. He does mention that he is sleeping 8 to 9 hours a day.  He feels that this is a little unusual, previously sleeping between 7 and 8 hours.  Reviewed HbA1c levels: Lab Results  Component Value Date   HGBA1C 5.3 01/02/2024   HGBA1C 5.1 06/02/2023   HGBA1C 5.6 12/05/2022   HGBA1C 5.3 05/31/2022   HGBA1C 6.1 (H) 11/09/2021   HGBA1C 6.0 (A) 06/25/2021   HGBA1C 6.1 (A) 11/20/2020   HGBA1C 6.6 (A) 10/18/2019   HGBA1C 8.3 (H) 06/30/2019   HGBA1C (H) 11/03/2010    5.7 (NOTE)                                                                       According to the ADA Clinical Practice Recommendations for 2011, when HbA1c is used as a screening test:   >=6.5%   Diagnostic of Diabetes Mellitus           (if abnormal result  is confirmed)  5.7-6.4%   Increased risk of developing Diabetes Mellitus  References:Diagnosis and Classification of Diabetes Mellitus,Diabetes Care,2011,34(Suppl 1):S62-S69 and Standards of Medical Care in         Diabetes - 2011,Diabetes Care,2011,34  (Suppl 1):S11-S61.  05/11/2022: HbA1c 5.6% 09/13/2020: HbA1c 6.3%   Pt is on: - Jardiance  10 mg daily in am -  Ozempic  0.25 >> 0.5 mg weekly We stopped metformin  05/2022.  He was taking the minimum dose.  Is checking sugars 0-1x a day: - am: 95-107 >> 74-89 >> 94-97 >> 81-94 >> 80s-90s >> 69-80 - 2h after b'fast: n/c >> 117-198 >> n/c >> 90-110 - before lunch: n/c >> 122 >> n/c - 2h after lunch: 120-130 (off Ozempic ) >> 97-121 >> n/c >> 90-110 - before dinner: n/c >> 99-128 >> n/c - 2h after dinner: 108-120s >> n/c   >> 120s >>  90s-114 >> 90-100 - bedtime: n/c >> 96-166 >> n/c - nighttime: n/c Lowest sugar was 74 >> 80s >. 69; it is unclear at which level he has hypoglycemia awareness Highest sugar was 198 >> ... 127 >> 114 >> 110.  Glucometer: Contour Next  Pt's meals are now occasional fruit days: - Breakfast: tea + millet bread or oatmeal or cereal or eggs (rarely) >> tea + almond milk, Indian flat bread with millet, 1/2 egg - Lunch: salad or sandwich >> berries, banana, papaya, watermelon -  yoghurt and fruit - Dinner: veggies + flat breads, chicken/fish/beef; occas rice >> tea and fruit >> salmon, chicken, veggies - Snacks:  + diet soda; nuts; indian snack He drinks bitter mellon juice - 2 days a week.  He feels that this is helping with his blood sugars. He uses almond milk.  -No CKD, last BUN/creatinine:  Lab Results  Component Value Date   BUN 11 01/02/2024   BUN 11 07/23/2019   CREATININE 1.22 01/02/2024   CREATININE 1.06 07/23/2019   Lab Results  Component Value Date   MICRALBCREAT NOTE 01/12/2024  On lisinopril  20 >> 5.  -+ HL; last set of lipids: Lab Results  Component Value Date   CHOL 120 01/02/2024   HDL 35 (L) 01/02/2024   LDLCALC 66 01/02/2024   TRIG 103 01/02/2024   CHOLHDL 3.4 01/02/2024  On Crestor  5 mg daily.  - last eye exam was 05/20/2023: No DR reportedly.   -He denies numbness and tingling in his feet.  Last foot exam was on 01/12/2024 here in clinic.  Pt has FH of DM in father. Mother had PMR.  He also has a history of HTN. He has a history of  transaminitis but his LFTs normalized 09/2020. He had GERD but this resolved after started C-PAP machine 09/2019 (moderate OSA). However, his OSA resolved after losing weight.  He does 2-3 week detox treatments in [place] 1-2x a year ([facility] [place]). He loses weight and feels better after going to these retreats. During the coronavirus pandemic, he could not exercise, was less active, and ate more >> started to feel poorly.  At that time, he was started on Jardiance  by Dr. Ladona and referred to endocrinology. In 2022, he had cardiac evaluation by Dr. Ganji - carotid U/S and 2D Echo: normal.  He has a family history of  HOCM.   He does yoga for 35 minutes every morning.  He also meditates for 5 to 10 minutes and does breathing exercises.  ROS: + see HPI  I reviewed pt's medications, allergies, PMH, social hx, family hx, and changes were documented in the history of present illness. Otherwise, unchanged from my initial visit note.  Past Medical History:  Diagnosis Date   Hypertension    Past Surgical History:  Procedure Laterality Date   CARDIAC CATHETERIZATION     CARDIAC SURGERY     stent placement   KNEE ARTHROSCOPY Right 11/25/2022   Social History   Socioeconomic History   Marital status: Married    Spouse name: Not on file   Number of children: 2   Years of education: Not on file   Highest education level: Not on file  Occupational History   Not on file  Tobacco Use   Smoking status: Former    Current packs/day: 0.00    Average packs/day: 0.3 packs/day for 10.0 years (2.5 ttl pk-yrs)    Types: Cigarettes    Start date: 64    Quit date: 42    Years since quitting: 29.0   Smokeless tobacco: Never  Vaping Use   Vaping status: Never Used  Substance and Sexual Activity   Alcohol use: Not Currently    Comment: occ   Drug use: No   Sexual activity: Yes  Other Topics Concern   Not on file  Social History Narrative   Not on file   Social Drivers of Health    Tobacco Use: Medium Risk (01/12/2024)   Patient History    Smoking Tobacco Use: Former  Smokeless Tobacco Use: Never    Passive Exposure: Not on file  Financial Resource Strain: Not on file  Food Insecurity: Not on file  Transportation Needs: Not on file  Physical Activity: Not on file  Stress: Not on file  Social Connections: Not on file  Intimate Partner Violence: Not on file  Depression (EYV7-0): Not on file  Alcohol Screen: Not on file  Housing: Not on file  Utilities: Not on file  Health Literacy: Not on file   Current Outpatient Medications on File Prior to Visit  Medication Sig Dispense Refill   Cholecalciferol (VITAMIN D -3 PO) Take 1 capsule by mouth daily.     clopidogrel  (PLAVIX ) 75 MG tablet Take 1 tablet by mouth once daily 90 tablet 2   Coenzyme Q10 (CO Q-10 PO) Take 1 capsule by mouth daily.     empagliflozin  (JARDIANCE ) 10 MG TABS tablet TAKE 1 TABLET BY MOUTH ONCE DAILY BEFORE BREAKFAST 90 tablet 3   glucose blood (CONTOUR NEXT TEST) test strip USE  STRIP TO CHECK GLUCOSE ONCE DAILY 100 each 5   lisinopril  (ZESTRIL ) 5 MG tablet Take 1 tablet (5 mg total) by mouth daily. 90 tablet 3   nitroGLYCERIN  (NITROSTAT ) 0.4 MG SL tablet Place 1 tablet (0.4 mg total) under the tongue every 5 (five) minutes as needed for chest pain. 25 tablet 1   rosuvastatin  (CRESTOR ) 10 MG tablet Take 1 tablet (10 mg total) by mouth daily. 90 tablet 2   Semaglutide ,0.25 or 0.5MG /DOS, (OZEMPIC , 0.25 OR 0.5 MG/DOSE,) 2 MG/3ML SOPN INJECT 0.5 MG  SUBCUTANEOUSLY ONCE A WEEK 9 mL 1   No current facility-administered medications on file prior to visit.   No Known Allergies Family History  Problem Relation Age of Onset   Hypertension Mother    Stroke Mother    Heart attack Father    Diabetes Father    PE: BP 120/70   Pulse 66   Ht 5' 8 (1.727 m)   Wt 173 lb 6.4 oz (78.7 kg)   SpO2 98%   BMI 26.37 kg/m  Wt Readings from Last 3 Encounters:  07/19/24 173 lb 6.4 oz (78.7 kg)   01/12/24 177 lb 9.6 oz (80.6 kg)  01/07/24 174 lb 14.4 oz (79.3 kg)   Constitutional: Normal weight, in NAD Eyes:  EOMI, no exophthalmos ENT: no neck masses, no cervical lymphadenopathy Cardiovascular: RRR, No MRG Respiratory: CTA B Musculoskeletal: no deformities Skin:no rashes Neurological: no tremor with outstretched hands  ASSESSMENT: 1. DM2, non-insulin-dependent, uncontrolled, with complications - CAD, s/p stent 2011 - Dr. Ladona  -He has no family history of medullary thyroid  cancer or multiple endocrine neoplasia and no personal history of pancreatitis.  2. HL  3.  Overweight  4.  Hypersomnia/fatigue  PLAN:  1. Patient with longstanding, previously uncontrolled type 2 diabetes with improvement in diabetes control after improving diet and stopping alcohol.  His nadir HbA1c was 5.1% but this increased to 5.3% at last visit.  Before this visit, he had significant stress with his brother having a massive MI and dying suddenly.  He was traveling and eating out and he gained weight.  At last visit sugars remained well-controlled so I did not suggest a change in regimen.  He was interested in reducing the Ozempic  dose but I explained that this was already a low dose.  Regarding Jardiance , he was on a half maximal dose but I did not feel that he needed to increase the dose especially since even the 10 mg of  Jardiance  was shown to be enough for cardiovascular and renal benefits. -At today's visit sugars are excellent despite recent episodes of flu during which he was not able to exercise and relax his diet.  He is now back exercising and started to improve his diet again.  He is doing an excellent job with this and also yoga and meditation.  Sugars are low normal.  HbA1c today is wonderful (see below).  Per his preference, we will go ahead and decrease the dose of Ozempic . - I suggested to:  Patient Instructions  Please continue: - Jardiance  10 mg daily before breakfast  You can  decrease: - Ozempic  0.25-0.5 mg weekly in a.m.   Please return in 6 months.  - we checked his HbA1c: 5.0% (lower) - advised to check sugars at different times of the day - 1x a day, rotating check times - advised for yearly eye exams >> he is UTD - return to clinic in 6 months  2. HL - Reviewed latest lipid panel from 12/2023: HDL slightly low, LDL above our target of less than 55: Lab Results  Component Value Date   CHOL 120 01/02/2024   HDL 35 (L) 01/02/2024   LDLCALC 66 01/02/2024   TRIG 103 01/02/2024   CHOLHDL 3.4 01/02/2024  - Continues Crestor  10 mg daily without side effects.  Also on co-Q10.  3.  Overweight - will continue the SGLT2 inhibitor and GLP-1 receptor agonist, which should also help with weight loss - In 2023, he lost approximately 25 pounds - He lost 20 pounds again in the year prior to our last OV - He lost a net 4 pounds since last visit - we we will decrease the dose of his Ozempic  per his preference  4.  Hypersomnia/fatigue - He describes that he sleeps 8 to 9 hours a day, which is more than he was sleeping previously, 7 to 8 hours a day - This could be related to his yoga/meditation, but will need to rule out hypothyroidism-will check TFTs today  Component     Latest Ref Rng 07/19/2024  Hemoglobin A1C     4.0 - 5.6 % 5.0   T4,Free(Direct)     0.8 - 1.8 ng/dL 1.4   Triiodothyronine,Free,Serum     2.3 - 4.2 pg/mL 3.3   TSH     0.40 - 4.50 mIU/L 1.33   Normal TFTs.  Lela Fendt, MD PhD [facility]

## 2024-07-20 ENCOUNTER — Ambulatory Visit: Payer: Self-pay | Admitting: Internal Medicine

## 2024-07-20 LAB — T3, FREE: T3, Free: 3.3 pg/mL (ref 2.3–4.2)

## 2024-07-20 LAB — TSH: TSH: 1.33 m[IU]/L (ref 0.40–4.50)

## 2024-07-20 LAB — T4, FREE: Free T4: 1.4 ng/dL (ref 0.8–1.8)

## 2025-02-14 ENCOUNTER — Ambulatory Visit: Admitting: Internal Medicine
